# Patient Record
Sex: Female | Born: 1975 | Race: Black or African American | Hispanic: No | Marital: Single | Smoking: Never smoker
Health system: Southern US, Community
[De-identification: ages and names within clinical notes are randomized; demographics above are authoritative.]

## PROBLEM LIST (undated history)

## (undated) DIAGNOSIS — B009 Herpesviral infection, unspecified: Secondary | ICD-10-CM

## (undated) DIAGNOSIS — G43909 Migraine, unspecified, not intractable, without status migrainosus: Secondary | ICD-10-CM

## (undated) DIAGNOSIS — E039 Hypothyroidism, unspecified: Secondary | ICD-10-CM

## (undated) DIAGNOSIS — M797 Fibromyalgia: Secondary | ICD-10-CM

## (undated) DIAGNOSIS — B029 Zoster without complications: Secondary | ICD-10-CM

## (undated) DIAGNOSIS — R112 Nausea with vomiting, unspecified: Secondary | ICD-10-CM

## (undated) HISTORY — DX: Hypothyroidism, unspecified: E03.9

## (undated) HISTORY — DX: Herpesviral infection, unspecified: B00.9

## (undated) HISTORY — DX: Nausea with vomiting, unspecified: R11.2

## (undated) HISTORY — PX: CHOLECYSTECTOMY: SHX55

## (undated) HISTORY — DX: Fibromyalgia: M79.7

## (undated) HISTORY — PX: TUBAL LIGATION: SHX77

## (undated) HISTORY — DX: Zoster without complications: B02.9

## (undated) HISTORY — DX: Migraine, unspecified, not intractable, without status migrainosus: G43.909

---

## 2000-11-07 HISTORY — PX: ABDOMINAL HYSTERECTOMY: SHX81

## 2003-01-04 ENCOUNTER — Emergency Department (HOSPITAL_COMMUNITY): Admission: EM | Admit: 2003-01-04 | Discharge: 2003-01-05 | Payer: Self-pay | Admitting: Emergency Medicine

## 2003-01-05 ENCOUNTER — Encounter: Payer: Self-pay | Admitting: Emergency Medicine

## 2003-03-13 ENCOUNTER — Other Ambulatory Visit: Admission: RE | Admit: 2003-03-13 | Discharge: 2003-03-13 | Payer: Self-pay | Admitting: Obstetrics and Gynecology

## 2003-07-28 ENCOUNTER — Encounter: Admission: RE | Admit: 2003-07-28 | Discharge: 2003-07-28 | Payer: Self-pay | Admitting: Internal Medicine

## 2003-07-28 ENCOUNTER — Encounter: Payer: Self-pay | Admitting: Internal Medicine

## 2005-01-15 ENCOUNTER — Ambulatory Visit (HOSPITAL_COMMUNITY): Admission: RE | Admit: 2005-01-15 | Discharge: 2005-01-15 | Payer: Self-pay | Admitting: *Deleted

## 2005-07-22 ENCOUNTER — Emergency Department (HOSPITAL_COMMUNITY): Admission: EM | Admit: 2005-07-22 | Discharge: 2005-07-23 | Payer: Self-pay | Admitting: Emergency Medicine

## 2006-05-22 ENCOUNTER — Emergency Department (HOSPITAL_COMMUNITY): Admission: EM | Admit: 2006-05-22 | Discharge: 2006-05-22 | Payer: Self-pay | Admitting: Family Medicine

## 2006-12-08 ENCOUNTER — Emergency Department (HOSPITAL_COMMUNITY): Admission: EM | Admit: 2006-12-08 | Discharge: 2006-12-09 | Payer: Self-pay | Admitting: Emergency Medicine

## 2007-02-12 ENCOUNTER — Emergency Department (HOSPITAL_COMMUNITY): Admission: EM | Admit: 2007-02-12 | Discharge: 2007-02-12 | Payer: Self-pay | Admitting: Family Medicine

## 2007-03-11 ENCOUNTER — Emergency Department (HOSPITAL_COMMUNITY): Admission: EM | Admit: 2007-03-11 | Discharge: 2007-03-11 | Payer: Self-pay | Admitting: Family Medicine

## 2007-06-20 ENCOUNTER — Encounter: Admission: RE | Admit: 2007-06-20 | Discharge: 2007-06-20 | Payer: Self-pay | Admitting: Internal Medicine

## 2007-06-21 ENCOUNTER — Emergency Department (HOSPITAL_COMMUNITY): Admission: EM | Admit: 2007-06-21 | Discharge: 2007-06-21 | Payer: Self-pay | Admitting: Emergency Medicine

## 2007-10-22 ENCOUNTER — Inpatient Hospital Stay (HOSPITAL_COMMUNITY): Admission: AD | Admit: 2007-10-22 | Discharge: 2007-10-22 | Payer: Self-pay | Admitting: Obstetrics and Gynecology

## 2007-11-13 ENCOUNTER — Emergency Department (HOSPITAL_COMMUNITY): Admission: EM | Admit: 2007-11-13 | Discharge: 2007-11-13 | Payer: Self-pay | Admitting: Family Medicine

## 2007-12-31 ENCOUNTER — Encounter: Admission: RE | Admit: 2007-12-31 | Discharge: 2007-12-31 | Payer: Self-pay | Admitting: Gastroenterology

## 2008-07-07 ENCOUNTER — Emergency Department (HOSPITAL_COMMUNITY): Admission: EM | Admit: 2008-07-07 | Discharge: 2008-07-07 | Payer: Self-pay | Admitting: Emergency Medicine

## 2008-08-06 ENCOUNTER — Emergency Department (HOSPITAL_BASED_OUTPATIENT_CLINIC_OR_DEPARTMENT_OTHER): Admission: EM | Admit: 2008-08-06 | Discharge: 2008-08-06 | Payer: Self-pay | Admitting: Emergency Medicine

## 2008-08-21 ENCOUNTER — Encounter: Admission: RE | Admit: 2008-08-21 | Discharge: 2008-08-21 | Payer: Self-pay | Admitting: Internal Medicine

## 2008-08-23 ENCOUNTER — Emergency Department (HOSPITAL_BASED_OUTPATIENT_CLINIC_OR_DEPARTMENT_OTHER): Admission: EM | Admit: 2008-08-23 | Discharge: 2008-08-23 | Payer: Self-pay | Admitting: Emergency Medicine

## 2008-09-30 ENCOUNTER — Encounter: Admission: RE | Admit: 2008-09-30 | Discharge: 2008-11-06 | Payer: Self-pay | Admitting: Neurosurgery

## 2008-12-16 ENCOUNTER — Ambulatory Visit: Payer: Self-pay | Admitting: Physical Medicine & Rehabilitation

## 2008-12-16 ENCOUNTER — Ambulatory Visit (HOSPITAL_COMMUNITY)
Admission: RE | Admit: 2008-12-16 | Discharge: 2008-12-16 | Payer: Self-pay | Admitting: Physical Medicine & Rehabilitation

## 2008-12-16 ENCOUNTER — Encounter
Admission: RE | Admit: 2008-12-16 | Discharge: 2008-12-16 | Payer: Self-pay | Admitting: Physical Medicine & Rehabilitation

## 2008-12-29 ENCOUNTER — Encounter: Admission: RE | Admit: 2008-12-29 | Discharge: 2008-12-30 | Payer: Self-pay | Admitting: Anesthesiology

## 2008-12-30 ENCOUNTER — Ambulatory Visit: Payer: Self-pay | Admitting: Anesthesiology

## 2009-01-08 ENCOUNTER — Emergency Department (HOSPITAL_BASED_OUTPATIENT_CLINIC_OR_DEPARTMENT_OTHER): Admission: EM | Admit: 2009-01-08 | Discharge: 2009-01-09 | Payer: Self-pay | Admitting: Emergency Medicine

## 2009-01-09 ENCOUNTER — Ambulatory Visit: Payer: Self-pay | Admitting: Gynecology

## 2009-10-01 IMAGING — US US TRANSVAGINAL NON-OB
1 series · 17 of 25 positions shown · non-contrast
Comparison: Previous exam 12/09/06.

CLINICAL DATA: Hysterectomy and right oophorectomy with pain since last night.  Negative urine pregnancy test.  
TRANSVAGINAL PELVIC ULTRASOUND:
TECHNIQUE: Transvaginal ultrasound examination of the pelvis was performed.

[Series 1: us transvaginal non-ob · 17 of 29 slices shown]
[im 1/29]
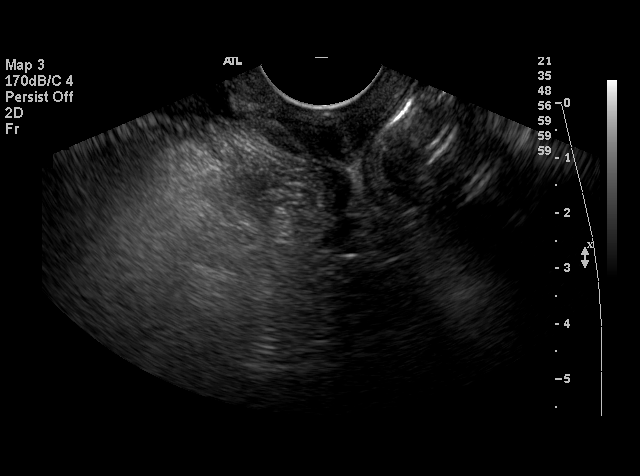
[im 3/29]
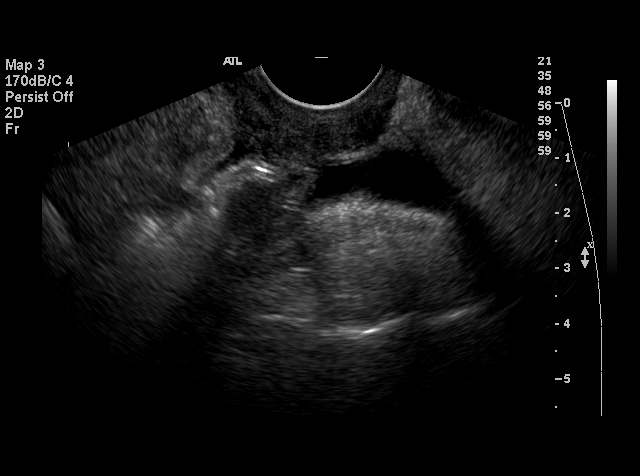
[im 4/29]
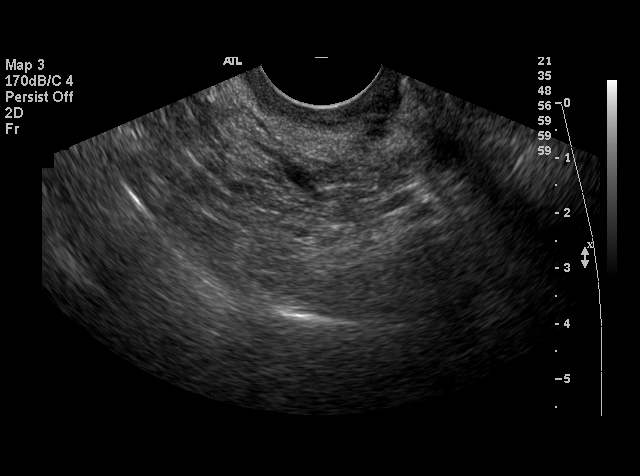
[im 6/29]
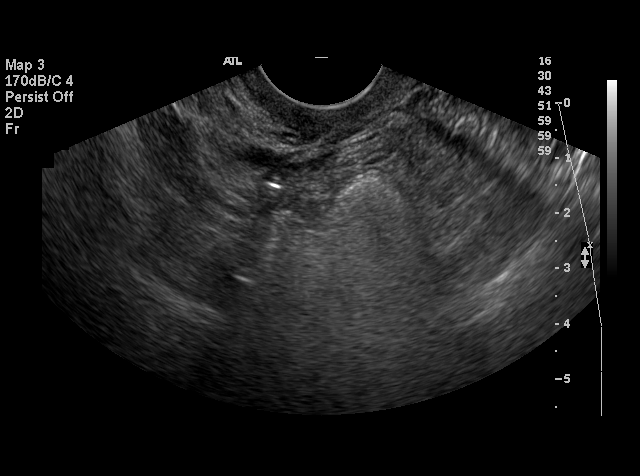
[im 8/29]
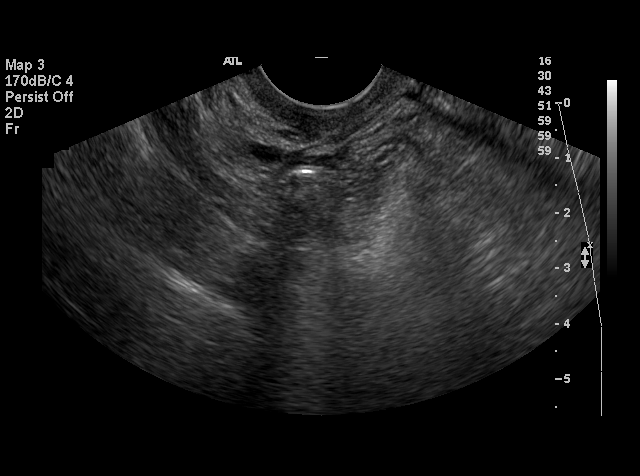
[im 10/29]
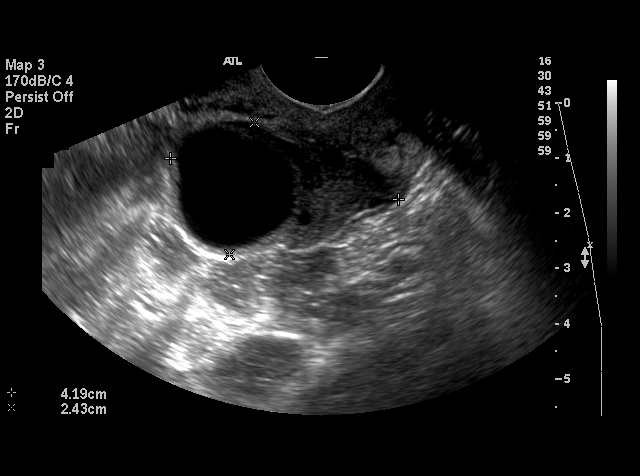
[im 11/29]
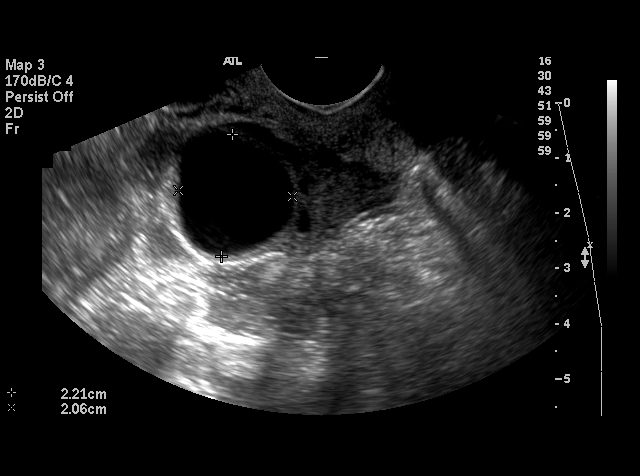
[im 13/29]
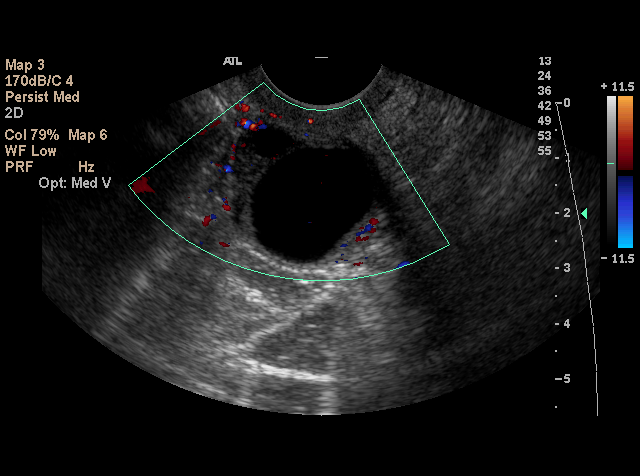
[im 15/29]
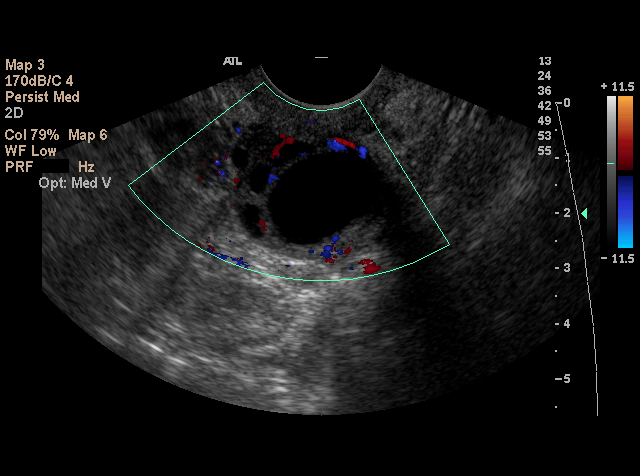
[im 16/29]
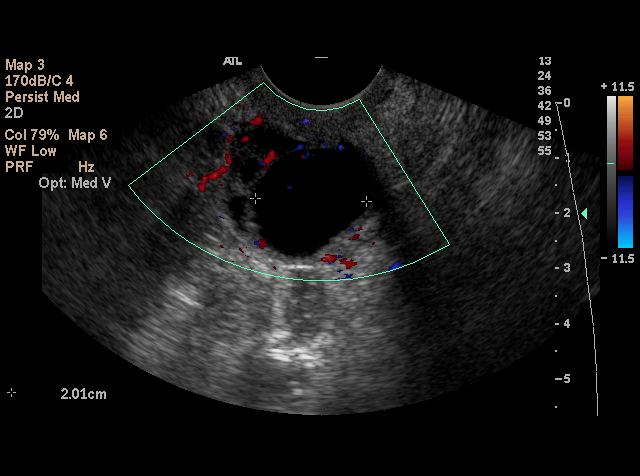
[im 18/29]
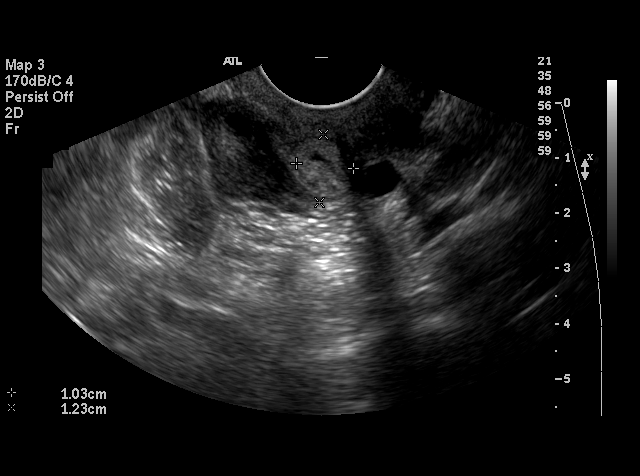
[im 19/29]
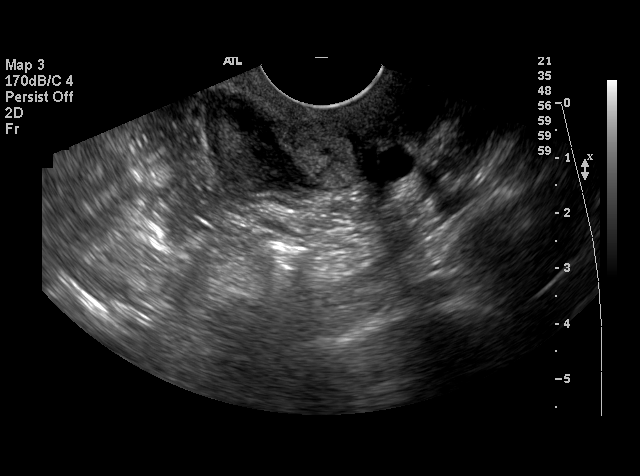
[im 22/29]
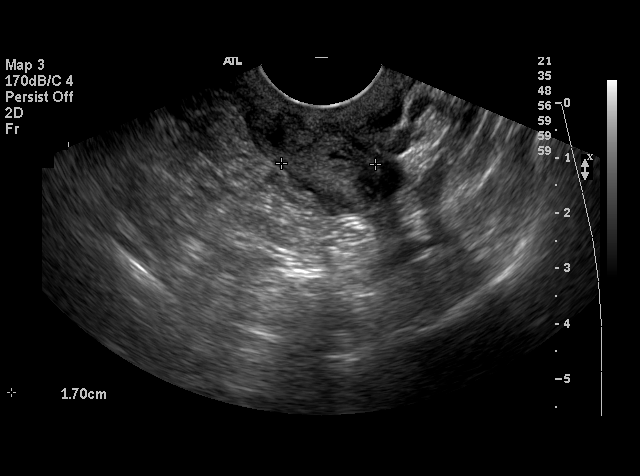
[im 23/29]
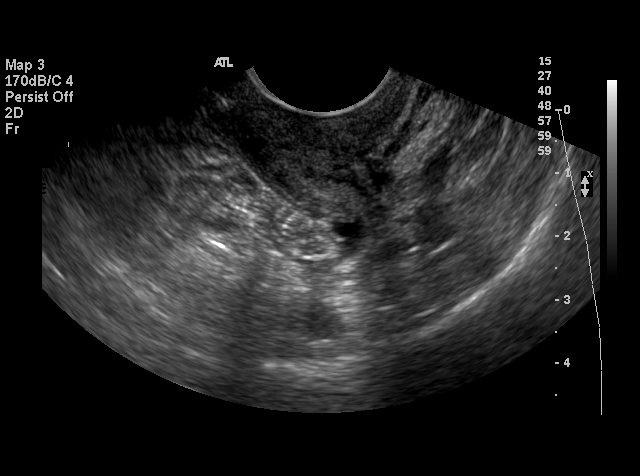
[im 25/29]
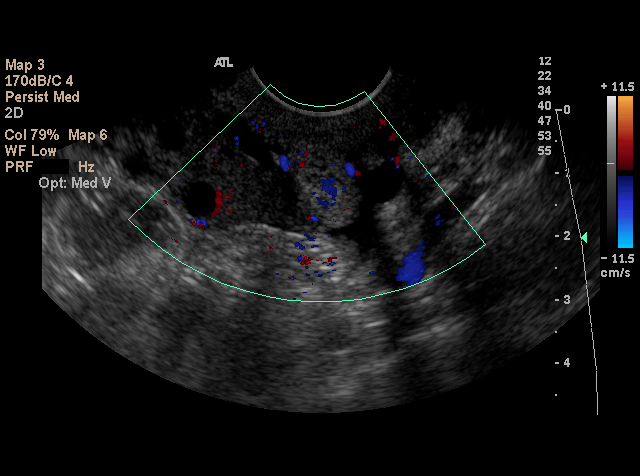
[im 26/29]
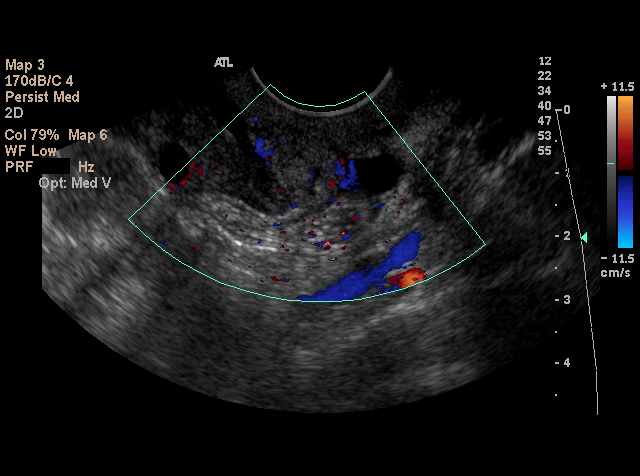
[im 29/29]
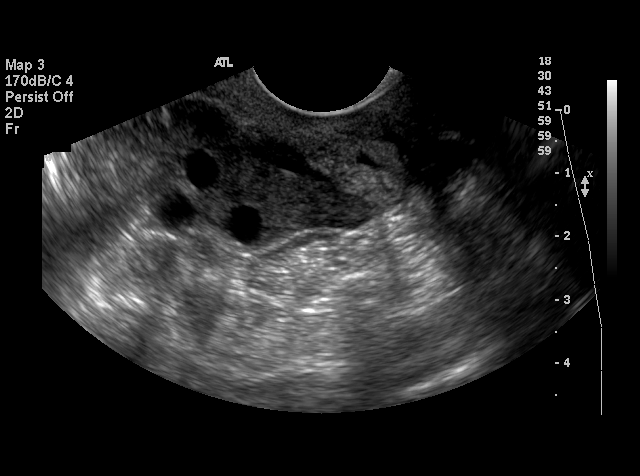

[17 of 25 positions shown; findings below may reference images not displayed]

FINDINGS: A normal vaginal cuff is identified.  The left ovary measures 4.2 x 2.4 x 2.8 cm and has a normal appearance.  A unilocular simple cyst is identified measuring 2.2 x 2.1 x 2.0 cm and in this premenopausal patient likely represents a dominant follicle.  Normal appearing small follicles are seen in the remaining stroma and a normal color flow pattern is identified.  Inferior to the left ovary is a small echogenic area of soft tissue measuring 1.0 x 1.2 x 1.7 cm.  Retrospectively, this was seen in the sagittal plane on similar images on 12/09/06 and likely represents a postoperative appearance of the fallopian tube on this side.  The patient did experience some tenderness in this region during scanning and if clinical concern warrants, follow-up to assess for stability can be undertaken.  Lack of interval change in the appearance since December 2006 strongly suggests that this is benign in etiology.  A small amount of simple free fluid is noted in the cul-de-sac.
IMPRESSION: 1.  Probable dominant follicle in the left ovary with an otherwise normal ovarian appearance.  
2.  Small amount of soft tissue inferior to the left ovary which appears stable in comparison with a prior exam from December 2006 and strongly suggests that this is benign in etiology and likely postoperative in nature.  A negative urine pregnancy test would exclude the possibility of an ectopic gestation.  Follow-up in 3 months can be undertaken to assess for stability of this suspected benign process if desired.

## 2010-02-05 ENCOUNTER — Ambulatory Visit: Payer: Self-pay | Admitting: Women's Health

## 2010-04-09 ENCOUNTER — Emergency Department (HOSPITAL_BASED_OUTPATIENT_CLINIC_OR_DEPARTMENT_OTHER): Admission: EM | Admit: 2010-04-09 | Discharge: 2010-04-09 | Payer: Self-pay | Admitting: Emergency Medicine

## 2011-01-29 ENCOUNTER — Emergency Department (INDEPENDENT_AMBULATORY_CARE_PROVIDER_SITE_OTHER): Payer: Medicaid Other

## 2011-01-29 ENCOUNTER — Emergency Department (HOSPITAL_BASED_OUTPATIENT_CLINIC_OR_DEPARTMENT_OTHER)
Admission: EM | Admit: 2011-01-29 | Discharge: 2011-01-29 | Disposition: A | Discharge status: Home / Self Care | Payer: Medicaid Other | Attending: Emergency Medicine | Admitting: Emergency Medicine

## 2011-01-29 DIAGNOSIS — R1032 Left lower quadrant pain: Secondary | ICD-10-CM | POA: Insufficient documentation

## 2011-01-29 DIAGNOSIS — G8929 Other chronic pain: Secondary | ICD-10-CM | POA: Insufficient documentation

## 2011-01-29 LAB — COMPREHENSIVE METABOLIC PANEL
AST: 21 U/L (ref 0–37)
Albumin: 4.2 g/dL (ref 3.5–5.2)
Alkaline Phosphatase: 67 U/L (ref 39–117)
CO2: 23 mEq/L (ref 19–32)
Chloride: 103 mEq/L (ref 96–112)
Creatinine, Ser: 0.7 mg/dL (ref 0.4–1.2)
GFR calc Af Amer: >60 mL/min (ref >60)
GFR calc non Af Amer: >60 mL/min (ref >60)
Glucose, Bld: 102 mg/dL — ABNORMAL HIGH (ref 70–99)
Potassium: 4.5 mEq/L (ref 3.5–5.1)
Sodium: 139 mEq/L (ref 135–145)

## 2011-01-29 LAB — CBC
HCT: 36.8 % (ref 36.0–46.0)
Hemoglobin: 12.8 g/dL (ref 12.0–15.0)
Platelets: 294 10*3/uL (ref 150–400)
WBC: 10.4 10*3/uL (ref 4.0–10.5)

## 2011-01-29 LAB — URINALYSIS, ROUTINE W REFLEX MICROSCOPIC
Glucose, UA: NEGATIVE mg/dL
Nitrite: NEGATIVE
Protein, ur: NEGATIVE mg/dL
Specific Gravity, Urine: 1.028 (ref 1.005–1.030)
pH: 6 (ref 5.0–8.0)

## 2011-01-29 LAB — DIFFERENTIAL
Basophils Relative: 0 % (ref 0–1)
Eosinophils Relative: 3 % (ref 0–5)
Lymphocytes Relative: 40 % (ref 12–46)
Neutro Abs: 5.2 10*3/uL (ref 1.7–7.7)
Neutrophils Relative %: 50 % (ref 43–77)

## 2011-01-30 LAB — URINE CULTURE: Culture  Setup Time: 201203242151

## 2011-02-17 LAB — DIFFERENTIAL
Basophils Absolute: 0.3 10*3/uL — ABNORMAL HIGH (ref 0.0–0.1)
Eosinophils Relative: 1 % (ref 0–5)
Lymphocytes Relative: 29 % (ref 12–46)
Lymphs Abs: 3.5 10*3/uL (ref 0.7–4.0)
Monocytes Absolute: 0.8 10*3/uL (ref 0.1–1.0)
Neutro Abs: 7.2 10*3/uL (ref 1.7–7.7)

## 2011-02-17 LAB — URINALYSIS, ROUTINE W REFLEX MICROSCOPIC
Bilirubin Urine: NEGATIVE
Glucose, UA: NEGATIVE mg/dL
Ketones, ur: NEGATIVE mg/dL
Nitrite: NEGATIVE
Specific Gravity, Urine: 1.022 (ref 1.005–1.030)
pH: 6 (ref 5.0–8.0)

## 2011-02-17 LAB — CBC
HCT: 42.8 % (ref 36.0–46.0)
Hemoglobin: 13.9 g/dL (ref 12.0–15.0)
RBC: 4.86 MIL/uL (ref 3.87–5.11)
WBC: 11.9 10*3/uL — ABNORMAL HIGH (ref 4.0–10.5)

## 2011-02-17 LAB — BASIC METABOLIC PANEL
Calcium: 9.8 mg/dL (ref 8.4–10.5)
Creatinine, Ser: 0.7 mg/dL (ref 0.4–1.2)
GFR calc non Af Amer: >60 mL/min (ref >60)
Glucose, Bld: 131 mg/dL — ABNORMAL HIGH (ref 70–99)
Sodium: 142 mEq/L (ref 135–145)

## 2011-03-14 ENCOUNTER — Emergency Department (HOSPITAL_COMMUNITY)
Admission: EM | Admit: 2011-03-14 | Discharge: 2011-03-15 | Disposition: A | Discharge status: Home / Self Care | Payer: Medicaid Other | Attending: Emergency Medicine | Admitting: Emergency Medicine

## 2011-03-14 ENCOUNTER — Emergency Department (HOSPITAL_COMMUNITY): Payer: Medicaid Other

## 2011-03-14 DIAGNOSIS — Z79899 Other long term (current) drug therapy: Secondary | ICD-10-CM | POA: Insufficient documentation

## 2011-03-14 DIAGNOSIS — E039 Hypothyroidism, unspecified: Secondary | ICD-10-CM | POA: Insufficient documentation

## 2011-03-14 DIAGNOSIS — G8929 Other chronic pain: Secondary | ICD-10-CM | POA: Insufficient documentation

## 2011-03-14 DIAGNOSIS — M549 Dorsalgia, unspecified: Secondary | ICD-10-CM | POA: Insufficient documentation

## 2011-03-14 DIAGNOSIS — R1033 Periumbilical pain: Secondary | ICD-10-CM | POA: Insufficient documentation

## 2011-03-14 LAB — CBC
Platelets: 294 10*3/uL (ref 150–400)
RBC: 4.37 MIL/uL (ref 3.87–5.11)
RDW: 12.1 % (ref 11.5–15.5)
WBC: 10.6 10*3/uL — ABNORMAL HIGH (ref 4.0–10.5)

## 2011-03-14 LAB — DIFFERENTIAL
Basophils Absolute: 0 10*3/uL (ref 0.0–0.1)
Eosinophils Absolute: 0.3 10*3/uL (ref 0.0–0.7)
Eosinophils Relative: 3 % (ref 0–5)
Neutrophils Relative %: 40 % — ABNORMAL LOW (ref 43–77)

## 2011-03-14 LAB — URINALYSIS, ROUTINE W REFLEX MICROSCOPIC
Glucose, UA: NEGATIVE mg/dL
Ketones, ur: NEGATIVE mg/dL
Nitrite: NEGATIVE
Protein, ur: NEGATIVE mg/dL
pH: 6 (ref 5.0–8.0)

## 2011-03-14 LAB — COMPREHENSIVE METABOLIC PANEL
ALT: 15 U/L (ref 0–35)
AST: 16 U/L (ref 0–37)
Albumin: 4.2 g/dL (ref 3.5–5.2)
Alkaline Phosphatase: 61 U/L (ref 39–117)
Chloride: 101 mEq/L (ref 96–112)
GFR calc Af Amer: >60 mL/min (ref >60)
Potassium: 4.2 mEq/L (ref 3.5–5.1)
Sodium: 137 mEq/L (ref 135–145)
Total Protein: 7.5 g/dL (ref 6.0–8.3)

## 2011-03-14 LAB — POCT PREGNANCY, URINE: Preg Test, Ur: NEGATIVE

## 2011-03-15 ENCOUNTER — Emergency Department (HOSPITAL_COMMUNITY): Payer: Medicaid Other

## 2011-03-15 ENCOUNTER — Encounter (HOSPITAL_COMMUNITY): Payer: Self-pay | Admitting: Radiology

## 2011-03-15 MED ORDER — IOHEXOL 300 MG/ML  SOLN
100.0000 mL | Freq: Once | INTRAMUSCULAR | Status: AC | PRN
  Administered 2011-03-15: 100 mL via INTRAVENOUS

## 2011-03-22 NOTE — Procedures (Signed)
NAME:  DYMIN, DINGLEDINE NO.:  [id number]   MEDICAL RECORD NO.:  [id number]          PATIENT TYPE:  REC   LOCATION:  [facility]                          FACILITY:  [facility]   PHYSICIAN:  Celene Kras, MD        DATE OF BIRTH:  12-Dec-1975   DATE OF PROCEDURE:  DATE OF DISCHARGE:                               OPERATIVE REPORT   [name] [facility].  I evaluated  her and reviewed the health and history form, 14-point review of  systems.   1. We recently considered going on the cervical facet intervention,      but she is having some right arm pain consistent with 6-7.  It goes      to her fingers.  She states she has trouble moving from time to      time.  We may need to get nerve conduction studies.  2. Other modifiable features discussed.  She is not working at this      time, another rationale to perform intervention is to move toward      return to work, and to minimize escalation controlled substances.      She appears to be forthright and engaging, and wants to be as      active as possible.  We will do what we can here.  I think as she      does have some arm pain, it is probably reasonable to go on to      cervical epidural to follow her expectantly.  If the arm pain      improves, we might address the facet, RF could be an option.  I      have reviewed the imaging.   Objectively, diffuse paracervical myofascial discomfort, positive  cervical facetal compression test positive the triceps reflex to the  affected side, right is somewhat diminished, pain with extension.  Nothing new neurologically.   IMPRESSION:  Degenerative spine disease, cervical spine with arm pain,  spondylosis.   PLAN:  Cervical epidural.  She has consented.   The patient was taken to the fluoroscopy and placed in prone position.  The neck prepped and draped in the usual fashion.  Using a [place]  needle, I advanced C7-T1 interspace without any evidence of  CSF, heme,  or paresthesia.  Test block uneventfully followed by 40 mg of Aristocort  and flushed needle.   She tolerated procedure well.  No complications from our procedure.  Appropriate recovery.  Discharge instructions given.           ______________________________  Celene Kras, MD     HH/MEDQ  D:  12/30/2008 13:31:30  T:  12/31/2008 47:82:95  Job:  [id number]
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
## 2011-04-07 ENCOUNTER — Other Ambulatory Visit (HOSPITAL_COMMUNITY): Payer: Self-pay | Admitting: Gastroenterology

## 2011-04-08 ENCOUNTER — Ambulatory Visit: Payer: Medicaid Other | Admitting: Women's Health

## 2011-04-11 ENCOUNTER — Ambulatory Visit (INDEPENDENT_AMBULATORY_CARE_PROVIDER_SITE_OTHER): Payer: Medicaid Other | Admitting: Women's Health

## 2011-04-11 ENCOUNTER — Ambulatory Visit (HOSPITAL_COMMUNITY)
Admission: RE | Admit: 2011-04-11 | Discharge: 2011-04-11 | Disposition: A | Discharge status: Home / Self Care | Payer: Medicaid Other | Source: Ambulatory Visit | Attending: Gastroenterology | Admitting: Gastroenterology

## 2011-04-11 DIAGNOSIS — K8020 Calculus of gallbladder without cholecystitis without obstruction: Secondary | ICD-10-CM | POA: Insufficient documentation

## 2011-04-11 DIAGNOSIS — N898 Other specified noninflammatory disorders of vagina: Secondary | ICD-10-CM

## 2011-04-11 DIAGNOSIS — N949 Unspecified condition associated with female genital organs and menstrual cycle: Secondary | ICD-10-CM

## 2011-04-11 DIAGNOSIS — B373 Candidiasis of vulva and vagina: Secondary | ICD-10-CM

## 2011-04-11 DIAGNOSIS — R109 Unspecified abdominal pain: Secondary | ICD-10-CM | POA: Insufficient documentation

## 2011-04-11 DIAGNOSIS — R112 Nausea with vomiting, unspecified: Secondary | ICD-10-CM | POA: Insufficient documentation

## 2011-04-18 ENCOUNTER — Ambulatory Visit (INDEPENDENT_AMBULATORY_CARE_PROVIDER_SITE_OTHER): Payer: Medicaid Other | Admitting: Women's Health

## 2011-04-18 ENCOUNTER — Other Ambulatory Visit: Payer: Medicaid Other

## 2011-04-18 DIAGNOSIS — R1032 Left lower quadrant pain: Secondary | ICD-10-CM

## 2011-04-18 DIAGNOSIS — N949 Unspecified condition associated with female genital organs and menstrual cycle: Secondary | ICD-10-CM

## 2011-04-20 ENCOUNTER — Other Ambulatory Visit (HOSPITAL_COMMUNITY): Payer: Medicaid Other

## 2011-05-06 ENCOUNTER — Encounter (HOSPITAL_COMMUNITY)
Admission: RE | Admit: 2011-05-06 | Discharge: 2011-05-06 | Disposition: A | Discharge status: Home / Self Care | Payer: Medicaid Other | Source: Ambulatory Visit | Attending: General Surgery | Admitting: General Surgery

## 2011-05-06 LAB — COMPREHENSIVE METABOLIC PANEL
AST: 16 U/L (ref 0–37)
CO2: 27 mEq/L (ref 19–32)
Calcium: 9.3 mg/dL (ref 8.4–10.5)
Creatinine, Ser: 0.89 mg/dL (ref 0.50–1.10)
GFR calc Af Amer: >60 mL/min (ref >60)
GFR calc non Af Amer: >60 mL/min (ref >60)
Glucose, Bld: 108 mg/dL — ABNORMAL HIGH (ref 70–99)

## 2011-05-06 LAB — CBC
MCH: 27.5 pg (ref 26.0–34.0)
MCHC: 33.2 g/dL (ref 30.0–36.0)
MCV: 82.8 fL (ref 78.0–100.0)
Platelets: 291 10*3/uL (ref 150–400)
RBC: 4.48 MIL/uL (ref 3.87–5.11)

## 2011-05-06 LAB — SURGICAL PCR SCREEN: Staphylococcus aureus: NEGATIVE

## 2011-05-06 LAB — DIFFERENTIAL
Eosinophils Absolute: 0.3 10*3/uL (ref 0.0–0.7)
Lymphs Abs: 5.1 10*3/uL — ABNORMAL HIGH (ref 0.7–4.0)
Monocytes Absolute: 0.7 10*3/uL (ref 0.1–1.0)
Monocytes Relative: 7 % (ref 3–12)
Neutrophils Relative %: 40 % — ABNORMAL LOW (ref 43–77)

## 2011-05-12 ENCOUNTER — Ambulatory Visit (HOSPITAL_COMMUNITY): Payer: Medicaid Other

## 2011-05-12 ENCOUNTER — Ambulatory Visit (HOSPITAL_COMMUNITY)
Admission: RE | Admit: 2011-05-12 | Discharge: 2011-05-12 | Disposition: A | Discharge status: Home / Self Care | Payer: Medicaid Other | Source: Ambulatory Visit | Attending: General Surgery | Admitting: General Surgery

## 2011-05-12 ENCOUNTER — Other Ambulatory Visit (INDEPENDENT_AMBULATORY_CARE_PROVIDER_SITE_OTHER): Payer: Self-pay | Admitting: General Surgery

## 2011-05-12 DIAGNOSIS — K8020 Calculus of gallbladder without cholecystitis without obstruction: Secondary | ICD-10-CM | POA: Insufficient documentation

## 2011-05-12 DIAGNOSIS — K660 Peritoneal adhesions (postprocedural) (postinfection): Secondary | ICD-10-CM | POA: Insufficient documentation

## 2011-05-12 DIAGNOSIS — Z01812 Encounter for preprocedural laboratory examination: Secondary | ICD-10-CM | POA: Insufficient documentation

## 2011-05-12 DIAGNOSIS — N83209 Unspecified ovarian cyst, unspecified side: Secondary | ICD-10-CM | POA: Insufficient documentation

## 2011-05-12 DIAGNOSIS — K824 Cholesterolosis of gallbladder: Secondary | ICD-10-CM

## 2011-05-12 DIAGNOSIS — K8010 Calculus of gallbladder with chronic cholecystitis without obstruction: Secondary | ICD-10-CM

## 2011-05-24 ENCOUNTER — Ambulatory Visit (INDEPENDENT_AMBULATORY_CARE_PROVIDER_SITE_OTHER): Payer: Medicaid Other | Admitting: General Surgery

## 2011-05-24 ENCOUNTER — Encounter (INDEPENDENT_AMBULATORY_CARE_PROVIDER_SITE_OTHER): Payer: Self-pay | Admitting: General Surgery

## 2011-05-24 VITALS — BP 118/88 | HR 72 | Temp 97.2°F | Ht 60.0 in | Wt 151.0 lb

## 2011-05-24 DIAGNOSIS — R112 Nausea with vomiting, unspecified: Secondary | ICD-10-CM

## 2011-05-24 DIAGNOSIS — Z9889 Other specified postprocedural states: Secondary | ICD-10-CM

## 2011-05-24 MED ORDER — PROMETHAZINE HCL 12.5 MG PO TABS: 12.5000 mg | ORAL_TABLET | Freq: Four times a day (QID) | ORAL | Status: DC | PRN

## 2011-05-24 NOTE — Progress Notes (Signed)
HPI The patient comes in today complaining of inability to eat, nausea, and vomiting. We had to give the patient an emesis basin in the waiting room because of nausea vomiting. She states is inserted and she's not been able to eat anything substantially. She also states that she's had significant constipation that she has treated herself with MiraLax.  PE She's got good bowel sounds on examination. Wounds are healing well with no evidence of infection.  Studiy review There no studies to review today.  Assessment Postoperative nausea vomiting status post laparoscopic cholecystectomy along with an examination of her ovarian cyst on the left side  Plan I will prescribe the patient some Phenergan for nausea and vomiting.  I will get a CBC and a cemented nature as she's been having any significant problems with an intra-abdominal infection or bile leak I will see the patient back in 2 weeks for followup

## 2011-05-27 NOTE — Op Note (Signed)
Sara Garrison, CHOUNG NO.:  [id number]  MEDICAL RECORD NO.:  [id number]  LOCATION:  [facility]                         FACILITY:  [facility]  PHYSICIAN:  Cherylynn Ridges, M.D.    DATE OF BIRTH:  [birth year]  DATE OF PROCEDURE:  05/12/2011 DATE OF DISCHARGE:  05/12/2011                              OPERATIVE REPORT   PREOPERATIVE DIAGNOSES: 1. Symptomatic cholelithiasis. 2. Left ovarian cyst with possible pathology.  POSTOPERATIVE DIAGNOSES: 1. Cholelithiasis with symptoms. 2. Normal cholangiogram. 3. Adhesions of omentum to the pelvic round ligament. 4. Left ovarian cyst.  PROCEDURES: 1. Diagnostic laparoscopy. 2. Laparoscopic adhesiolysis. 3. Laparoscopic cholecystectomy with cholangiogram.  SURGEON:  Cherylynn Ridges, MD  ASSISTANT:  Dr. Janee Morn.  ANESTHESIA:  General endotracheal.  ESTIMATED BLOOD LOSS:  Less than 30 mL.  COMPLICATIONS AND CONDITIONS:  Stable.  INDICATIONS FOR OPERATION:  The patient is a 35 year old recently found to have gallstones with symptoms that are seemed to not be related to a gallstone with mostly left lower quadrant and left upper quadrant pain. She comes in now for lap chole and also for a diagnosis of possible pathology and a pelvic gynecological organs.  OPERATION:  The patient was taken to the operating room, placed on table in supine position.  After an adequate general endotracheal anesthetic was administered, she was prepped and draped in the usual sterile manner, exposing the entire abdomen.  After proper time-out was performed identifying the patient and procedure be performed, a supraumbilical midline incision was made using #15 blade and taken down to the midline fascia.  We incised the fascia with 15 blade and then grabbed the edges with Kocher clamps.  We pulled up on the Kocher clamps while bluntly dissecting down into the peritoneal cavity.  This was done with a Kelly clamp.  Once this was done, a pursestring  suture of 0 Vicryl was passed around the fascial opening which secured in the [facility] cannula which was subsequently passed.  We then insufflated carbon dioxide gas up to a maximal intra- abdominal pressure of 50 mmHg.  Once this was done, two right upper quadrant 5-mm cannulas in the right costal margin area were passed under direct vision.  We then placed the patient in Trendelenburg position with her right side tilted down in order to inspect the left ovarian organs and left gynecological organs.  It was noted that there was a tongue of omentum adhesed to the round ligament and also the left para-lateral pelvic wall which we were able to dissect away free as our laparoscopic adhesiolysis.  We are able to visualize the left ovary also which appeared to be enlarged and have a large cystic artery, but did not have any other evidence of pathology. Once this was done, we took some pictures of this area.  We went ahead and placed a subxiphoid 5-mm cannula under direct vision.  We went then placed the patient in reverse Trendelenburg with the left side tilted down to perform the cholecystectomy.  The gallbladder was retracted towards the right upper quadrant and the anterior abdominal wall.  The peritoneum overlying the triangle of Calot and hepatic duodenal triangle was dissected free as I isolated the cystic duct and  cystic artery.  Once the cystic duct was isolated, we clipped along the gallbladder inside, made it cholecystoductotomy through which a Cook catheter was passed in order to perform a cholangiogram.  The cholangiogram showed good flow into the duodenum and good proximal filling.  No evidence of intraductal filling defects.  Once cholangiogram was completed, we removed the catheter that was in place, clipped the distal cystic duct x3 and transected it.  We then dissected out the cystic artery which we clipped proximally and distally and then transected it.  We then dissected  out the gallbladder from its bed with minimal difficulty, although we did perforate the back wall of the gallbladder.  There was no spillage of stone and only minimal amount of spillage of bile.  Once the gallbladder was completely detached from the liver using an EndoCatch bag to retrieve it from the supraumbilical site.  Once this was done, we irrigated with a liter of saline and the bed, aspirated all fluid and gas from above and around the liver as we flattened out the patient and removed all cannulas.  The supraumbilical site was closed using a pursestring suture which was in place.  All other sites, we injected with 0.25% Marcaine with epi. We closed the skin at the supraumbilical site using running subcuticular stitch of 4 Monocryl.  All counts were correct including needles, sponges and instrument.  Once that was done, a sterile dressing applied including Dermabond, Steri-Strips and Tegaderm.     Cherylynn Ridges, M.D.     JOW/MEDQ  D:  05/12/2011  T:  05/13/2011  Job:  [id number]  Electronically Signed by Jimmye Norman M.D. on 05/26/2011 11:59:22 PM

## 2011-06-07 ENCOUNTER — Encounter (INDEPENDENT_AMBULATORY_CARE_PROVIDER_SITE_OTHER): Payer: Medicaid Other | Admitting: General Surgery

## 2011-06-21 ENCOUNTER — Encounter (INDEPENDENT_AMBULATORY_CARE_PROVIDER_SITE_OTHER): Payer: Self-pay

## 2011-06-21 ENCOUNTER — Encounter (INDEPENDENT_AMBULATORY_CARE_PROVIDER_SITE_OTHER): Payer: Self-pay | Admitting: General Surgery

## 2011-06-21 ENCOUNTER — Ambulatory Visit (INDEPENDENT_AMBULATORY_CARE_PROVIDER_SITE_OTHER): Payer: Medicaid Other | Admitting: General Surgery

## 2011-06-21 DIAGNOSIS — G8918 Other acute postprocedural pain: Secondary | ICD-10-CM

## 2011-06-21 DIAGNOSIS — R109 Unspecified abdominal pain: Secondary | ICD-10-CM

## 2011-06-21 LAB — CBC WITH DIFFERENTIAL/PLATELET
Basophils Absolute: 0 10*3/uL (ref 0.0–0.1)
Lymphocytes Relative: 47 % — ABNORMAL HIGH (ref 12–46)
Neutro Abs: 3.5 10*3/uL (ref 1.7–7.7)
Platelets: 300 10*3/uL (ref 150–400)
RDW: 13.2 % (ref 11.5–15.5)
WBC: 8.1 10*3/uL (ref 4.0–10.5)

## 2011-06-21 LAB — HEPATIC FUNCTION PANEL
Bilirubin, Direct: 0.1 mg/dL (ref 0.0–0.3)
Total Bilirubin: 0.3 mg/dL (ref 0.3–1.2)

## 2011-06-21 NOTE — Progress Notes (Signed)
HPI The patient continues to complain of nausea and abdominal pain particularly in the periumbilical area.  PE On her examination she has no evidence of hernia in her umbilical area. She has normal active bowel sounds no palpable masses and no peritonitis .  Studiy review There are currently no studies to review however I will check a CBC and a Cmet.  Assessment Postoperative continued nausea and abdominal pain. Clinically I did not have a lot of concern that he she's having any postoperative complications however I will check some studies to confirm this.  Plan Check a CBC and a liver function panel to determine if she has any concern for postoperative bile leak

## 2011-06-29 ENCOUNTER — Encounter (HOSPITAL_BASED_OUTPATIENT_CLINIC_OR_DEPARTMENT_OTHER): Payer: Self-pay | Admitting: *Deleted

## 2011-06-29 ENCOUNTER — Emergency Department (HOSPITAL_BASED_OUTPATIENT_CLINIC_OR_DEPARTMENT_OTHER)
Admission: EM | Admit: 2011-06-29 | Discharge: 2011-06-29 | Disposition: A | Discharge status: Home / Self Care | Payer: Medicaid Other | Attending: Emergency Medicine | Admitting: Emergency Medicine

## 2011-06-29 DIAGNOSIS — E039 Hypothyroidism, unspecified: Secondary | ICD-10-CM | POA: Insufficient documentation

## 2011-06-29 DIAGNOSIS — R109 Unspecified abdominal pain: Secondary | ICD-10-CM

## 2011-06-29 DIAGNOSIS — IMO0001 Reserved for inherently not codable concepts without codable children: Secondary | ICD-10-CM | POA: Insufficient documentation

## 2011-06-29 DIAGNOSIS — G8929 Other chronic pain: Secondary | ICD-10-CM | POA: Insufficient documentation

## 2011-06-29 LAB — URINALYSIS, ROUTINE W REFLEX MICROSCOPIC
Ketones, ur: 15 mg/dL — AB
Leukocytes, UA: NEGATIVE
Nitrite: NEGATIVE
Protein, ur: NEGATIVE mg/dL
pH: 5 (ref 5.0–8.0)

## 2011-06-29 NOTE — ED Notes (Signed)
Pt states abd pain " on and off" since July, when gall bladder was removed

## 2011-06-29 NOTE — ED Notes (Signed)
Pt sent with prescription for out pt ct and labs, but pt decided to check in ed instead.

## 2011-06-29 NOTE — ED Provider Notes (Signed)
History     CSN: [id number] Arrival date & time: 06/29/2011  4:26 PM  Chief Complaint  Patient presents with  . Abdominal Pain   Patient is a 35 y.o. female presenting with abdominal pain. The history is provided by the patient. No language interpreter was used.  Abdominal Pain The primary symptoms of the illness include abdominal pain. The primary symptoms of the illness do not include fever, shortness of breath, vomiting, diarrhea, dysuria, vaginal discharge or vaginal bleeding. The current episode started more than 2 days ago. The onset of the illness was gradual. The problem has been gradually worsening.  Associated with: gallbladder surgery in July. The patient states that she believes she is currently not pregnant. The patient has had a change in bowel habit. Additional symptoms associated with the illness include constipation and frequency. Significant associated medical issues do not include PUD, sickle cell disease or diverticulitis.  Pt was seen by Dr. Lovell Sheehan in the office today.  Pt was given an Rx for a laxative and dilaudid.  Pt has seen Dr. Lindie Spruce for the same and was told it may take some time to heal.  Pt was sent out a ua.   Past Medical History  Diagnosis Date  . HSV-1 (herpes simplex virus 1) infection   . Hypothyroid   . Fibromyalgia   . Migraines   . Nausea & vomiting   . Shingles     Past Surgical History  Procedure Date  . Abdominal hysterectomy 2002    RSO   . Tubal ligation   . Cholecystectomy     Family History  Problem Relation Age of Onset  . Hypertension Mother   . Diabetes Mother     History  Substance Use Topics  . Smoking status: Never Smoker   . Smokeless tobacco: Not on file  . Alcohol Use: Yes    OB History    Grav Para Term Preterm Abortions TAB SAB Ect Mult Living                  Review of Systems  Constitutional: Negative for fever.  Respiratory: Negative for shortness of breath.   Gastrointestinal: Positive for abdominal  pain and constipation. Negative for vomiting and diarrhea.  Genitourinary: Positive for frequency. Negative for dysuria, vaginal bleeding and vaginal discharge.  All other systems reviewed and are negative.    Physical Exam  BP 136/72  Pulse 82  Temp(Src) 97.9 F (36.6 C) (Oral)  Resp 16  Wt 156 lb (70.761 kg)  SpO2 100%  Physical Exam  Nursing note and vitals reviewed. Constitutional: She is oriented to person, place, and time. She appears well-developed and well-nourished.  HENT:  Head: Normocephalic and atraumatic.  Eyes: Conjunctivae and EOM are normal. Pupils are equal, round, and reactive to light.  Neck: Normal range of motion. Neck supple.  Cardiovascular: Normal rate.   Pulmonary/Chest: Effort normal.  Abdominal: Soft. There is tenderness.  Musculoskeletal: Normal range of motion.  Neurological: She is alert and oriented to person, place, and time. She has normal reflexes.  Skin: Skin is warm and dry.  Psychiatric: She has a normal mood and affect.    ED Course  Procedures  MDM ua shows ketones,  Pt advised to follow with Dr. Lindie Spruce and Dr.  Lovell Sheehan as scheduled.  Medications as prescribed by Dr. Lovell Sheehan.      Langston Masker, [place] 06/29/11 1850  [place], [place] 06/29/11 [phone]

## 2011-06-30 NOTE — ED Provider Notes (Signed)
Medical screening examination/treatment/procedure(s) were performed by non-physician practitioner and as supervising physician I was immediately available for consultation/collaboration.   Ociel Retherford M Meerab Maselli, MD 06/30/11 0010 

## 2011-07-13 ENCOUNTER — Other Ambulatory Visit: Payer: Self-pay | Admitting: Internal Medicine

## 2011-07-15 ENCOUNTER — Ambulatory Visit
Admission: RE | Admit: 2011-07-15 | Discharge: 2011-07-15 | Disposition: A | Discharge status: Home / Self Care | Payer: Medicaid Other | Source: Ambulatory Visit | Attending: Internal Medicine | Admitting: Internal Medicine

## 2011-07-15 MED ORDER — IOHEXOL 300 MG/ML  SOLN
100.0000 mL | Freq: Once | INTRAMUSCULAR | Status: AC | PRN
  Administered 2011-07-15: 100 mL via INTRAVENOUS

## 2011-08-08 LAB — BASIC METABOLIC PANEL
BUN: 7
Creatinine, Ser: 0.7
GFR calc Af Amer: >60
GFR calc non Af Amer: >60

## 2011-08-08 LAB — URINALYSIS, ROUTINE W REFLEX MICROSCOPIC
Glucose, UA: NEGATIVE
Protein, ur: NEGATIVE
Specific Gravity, Urine: 1.022
pH: 5.5

## 2011-08-08 LAB — DIFFERENTIAL
Lymphocytes Relative: 36
Lymphs Abs: 4.5 — ABNORMAL HIGH
Neutro Abs: 7.1
Neutrophils Relative %: 56

## 2011-08-08 LAB — CBC
Platelets: 263
RBC: 4.68
WBC: 12.6 — ABNORMAL HIGH

## 2011-08-12 LAB — URINALYSIS, ROUTINE W REFLEX MICROSCOPIC
Ketones, ur: NEGATIVE
Nitrite: NEGATIVE
Specific Gravity, Urine: >1.03 — ABNORMAL HIGH
pH: 5.5

## 2011-08-12 LAB — CBC
Hemoglobin: 12.5
RDW: 12

## 2011-08-22 LAB — URINALYSIS, ROUTINE W REFLEX MICROSCOPIC
Glucose, UA: NEGATIVE
Protein, ur: NEGATIVE
Urobilinogen, UA: 0.2

## 2011-08-22 LAB — POCT PREGNANCY, URINE: Operator id: 256701

## 2011-10-16 ENCOUNTER — Emergency Department (HOSPITAL_BASED_OUTPATIENT_CLINIC_OR_DEPARTMENT_OTHER)
Admission: EM | Admit: 2011-10-16 | Discharge: 2011-10-16 | Disposition: A | Discharge status: Home / Self Care | Payer: Medicaid Other | Attending: Emergency Medicine | Admitting: Emergency Medicine

## 2011-10-16 ENCOUNTER — Encounter (HOSPITAL_BASED_OUTPATIENT_CLINIC_OR_DEPARTMENT_OTHER): Payer: Self-pay | Admitting: *Deleted

## 2011-10-16 DIAGNOSIS — Z79899 Other long term (current) drug therapy: Secondary | ICD-10-CM | POA: Insufficient documentation

## 2011-10-16 DIAGNOSIS — R109 Unspecified abdominal pain: Secondary | ICD-10-CM | POA: Insufficient documentation

## 2011-10-16 DIAGNOSIS — J111 Influenza due to unidentified influenza virus with other respiratory manifestations: Secondary | ICD-10-CM | POA: Insufficient documentation

## 2011-10-16 DIAGNOSIS — R42 Dizziness and giddiness: Secondary | ICD-10-CM | POA: Insufficient documentation

## 2011-10-16 DIAGNOSIS — G8929 Other chronic pain: Secondary | ICD-10-CM

## 2011-10-16 DIAGNOSIS — IMO0001 Reserved for inherently not codable concepts without codable children: Secondary | ICD-10-CM | POA: Insufficient documentation

## 2011-10-16 DIAGNOSIS — R51 Headache: Secondary | ICD-10-CM | POA: Insufficient documentation

## 2011-10-16 DIAGNOSIS — E039 Hypothyroidism, unspecified: Secondary | ICD-10-CM | POA: Insufficient documentation

## 2011-10-16 MED ORDER — DIAZEPAM 5 MG PO TABS: 5.0000 mg | ORAL_TABLET | Freq: Four times a day (QID) | ORAL | Status: AC | PRN

## 2011-10-16 NOTE — ED Provider Notes (Signed)
History    Scribed for Hurman Horn, MD, the patient was seen in room MH07/MH07. This chart was scribed by Katha Cabal.   CSN: [id number] Arrival date & time: 10/16/2011  9:49 PM   First MD Initiated Contact with Patient 10/16/11 2222      Chief Complaint  Patient presents with  . Sore Throat    (Consider location/radiation/quality/duration/timing/severity/associated sxs/prior treatment) Patient is a 35 y.o. female presenting with pharyngitis and abdominal pain. The history is provided by the patient. No language interpreter was used.  Sore Throat This is a new problem. Episode onset: 4-5 days ago  The problem occurs constantly. The problem has been gradually worsening. Associated symptoms include abdominal pain and headaches. Pertinent negatives include no chest pain and no shortness of breath. Associated symptoms comments: Fever, congestion, myalgias, congestion, rhinorrhea, cough.  Patient reports that multiple family members with same symptoms.. Exacerbated by: movement  Treatments tried: Tramadol  The treatment provided mild relief.  Abdominal Pain The primary symptoms of the illness include abdominal pain and fever. The primary symptoms of the illness do not include shortness of breath, vomiting, diarrhea, dysuria or vaginal bleeding. Episode onset: 4-5 days ago  The onset of the illness was gradual.  The abdominal pain has been gradually worsening since its onset. The abdominal pain is located in the LLQ.  Additional symptoms associated with the illness include chills. Symptoms associated with the illness do not include back pain. Significant associated medical issues do not include diabetes or HIV. Associated medical issues comments: fibromyalgia .    Patient has history of fibromyalgia and takes Dilaudid (rarely) and Tramadol as needed for pain.  Patient has chronic headaches, abdominal pain and body aches that are well controlled.    This 35 year old female has chronic  generalized pain chronic abdominal pain and chronic headaches on a weekly if not daily basis at baseline for years which are controlled with tramadol and Dilaudid, she now presents for flulike illness with multiple family members at the same symptoms with fever nasal congestion cough sore throat runny nose worse than usual body aches for the patient worse than usual abdominal pain the patient had typical exacerbation of headache for the patient without rash altered mental status or shortness of breath.  Past Medical History  Diagnosis Date  . HSV-1 (herpes simplex virus 1) infection   . Hypothyroid   . Fibromyalgia   . Migraines   . Nausea & vomiting   . Shingles     Past Surgical History  Procedure Date  . Abdominal hysterectomy 2002    RSO   . Tubal ligation   . Cholecystectomy     Family History  Problem Relation Age of Onset  . Hypertension Mother   . Diabetes Mother     History  Substance Use Topics  . Smoking status: Never Smoker   . Smokeless tobacco: Not on file  . Alcohol Use: Yes    OB History    Grav Para Term Preterm Abortions TAB SAB Ect Mult Living                  Review of Systems  Constitutional: Positive for fever, chills and appetite change.       10 Systems reviewed and are negative for acute change except as noted in the HPI.  HENT: Positive for congestion and sore throat.   Eyes: Negative for discharge and redness.  Respiratory: Positive for cough. Negative for shortness of breath.   Cardiovascular: Negative for  chest pain.  Gastrointestinal: Positive for abdominal pain. Negative for vomiting and diarrhea.  Genitourinary: Negative for dysuria and vaginal bleeding.  Musculoskeletal: Positive for myalgias. Negative for back pain.  Skin: Negative for rash.  Neurological: Positive for light-headedness and headaches. Negative for syncope, weakness and numbness.  Psychiatric/Behavioral: Negative for hallucinations and confusion.       No behavior  change.    Allergies  Aspirin; Codeine; Lortab; Penicillins; and Tylenol  Home Medications   Current Outpatient Rx  Name Route Sig Dispense Refill  . PULMICORT FLEXHALER IN Inhalation Inhale 2 puffs into the lungs daily as needed. Shortness of breath and wheezing     . CYCLOBENZAPRINE HCL 10 MG PO TABS Oral Take 10 mg by mouth daily as needed. For muscle spasms     . DIPHENHYDRAMINE HCL 25 MG PO CAPS Oral Take 25 mg by mouth at bedtime as needed. For allergic reaction     . HYDROMORPHONE HCL 4 MG PO TABS Oral Take 2 mg by mouth every 4 (four) hours as needed. For pain    . HYDROXYZINE HCL 50 MG PO TABS Oral Take 100-150 mg by mouth at bedtime.     Marland Kitchen LEVOTHYROXINE SODIUM 50 MCG PO TABS Oral Take 50 mcg by mouth daily.      . TRAMADOL HCL 50 MG PO TABS Oral Take 50-100 mg by mouth every 6 (six) hours as needed. pain    . DIAZEPAM 5 MG PO TABS Oral Take 1 tablet (5 mg total) by mouth every 6 (six) hours as needed for anxiety (spasms). 10 tablet 0    BP 127/81  Pulse 100  Temp(Src) 99.1 F (37.3 C) (Oral)  Resp 20  Ht 5' (1.524 m)  Wt 156 lb (70.761 kg)  BMI 30.47 kg/m2  SpO2 99%  Physical Exam  Nursing note and vitals reviewed. Constitutional:       Awake, alert, nontoxic appearance.  HENT:  Head: Atraumatic.  Right Ear: Tympanic membrane normal.  Left Ear: Tympanic membrane normal.  Mouth/Throat: Posterior oropharyngeal erythema (mildly) present. No oropharyngeal exudate.  Eyes: EOM are normal. Pupils are equal, round, and reactive to light. Right eye exhibits no discharge. Left eye exhibits no discharge.  Neck: Neck supple.       Diffuse soft tissue tenderness   Cardiovascular: Normal rate and regular rhythm.   No murmur heard. Pulmonary/Chest: Effort normal and breath sounds normal. No stridor. No respiratory distress. She has no wheezes. She has no rales. She exhibits tenderness.  Abdominal: Soft. Bowel sounds are normal. She exhibits no mass. There is tenderness. There  is no rebound.       Mildly tender throughout, moderately tender in lower abdomen   Musculoskeletal: She exhibits tenderness.       Baseline ROM, no obvious new focal weakness, diffuse back tenderness   Lymphadenopathy:    She has no cervical adenopathy.  Neurological:       Mental status and motor strength appears baseline for patient and situation.  Skin: No rash noted.  Psychiatric: She has a normal mood and affect.   neurologic exam includes normal mental status major cranial nerves are intact pupils reactive extraocular movements intact peripheral visual fields full to confrontation no facial asymmetry motor strength is 5 out of 5 without pronator drift in all 4 extremities she has normal light touch in all 4 extremities normal finger to nose testing bilaterally.  ED Course  Procedures (including critical care time)   DIAGNOSTIC STUDIES: Oxygen Saturation is  99% on room air, normal by my interpretation.     COORDINATION OF CARE: 11:31 PM  Physical exam complete.  Discussed plan of treatment with patient.  Will provide patient with muscle relaxer's.  Work note.    LABS / RADIOLOGY:   Labs Reviewed - No data to display No results found.       MDM  I doubt any other EMC precluding discharge at this time including, but not necessarily limited to the following:SBI, peritonitis.        IMPRESSION: 1. Influenza   2. Chronic abdominal pain   3. Chronic headaches     Scribe I personally performed the services described in this documentation, which was scribed in my presence. The recorded information has been reviewed and considered.             Hurman Horn, MD 10/17/11 [phone]

## 2011-10-16 NOTE — ED Notes (Signed)
Pt states she has had a sore throat since yesterday. Also c/o right ear pain and headache. Also c/o LLQ pain since Wed. Denies Urinary s/s or discharge. No diarrhea. Also feel light-headed and dizzy.

## 2012-01-23 ENCOUNTER — Other Ambulatory Visit: Payer: Self-pay | Admitting: Women's Health

## 2012-01-23 DIAGNOSIS — N83202 Unspecified ovarian cyst, left side: Secondary | ICD-10-CM

## 2012-01-23 NOTE — Progress Notes (Signed)
Telephone call from the patient, questioning when to have followup ultrasound, Will call and schedule. History of  thickwalled cystic irregular  30 x 28 x 25 mm,  thickwalled cystic 20 x 14 x 14 mm in the left adnexa also a 18 x 13 x 9 mm. right adnexal negative noted on pelvic US 6/12. Had gallbladder surgery summer of 2012.

## 2012-02-16 ENCOUNTER — Ambulatory Visit: Payer: Medicaid Other | Admitting: Women's Health

## 2012-02-16 ENCOUNTER — Ambulatory Visit (INDEPENDENT_AMBULATORY_CARE_PROVIDER_SITE_OTHER): Payer: Medicaid Other

## 2012-02-16 ENCOUNTER — Other Ambulatory Visit: Payer: Medicaid Other

## 2012-02-16 ENCOUNTER — Other Ambulatory Visit: Payer: Self-pay | Admitting: Gynecology

## 2012-02-16 ENCOUNTER — Encounter: Payer: Self-pay | Admitting: Women's Health

## 2012-02-16 ENCOUNTER — Ambulatory Visit (INDEPENDENT_AMBULATORY_CARE_PROVIDER_SITE_OTHER): Payer: Medicaid Other | Admitting: Women's Health

## 2012-02-16 DIAGNOSIS — N83209 Unspecified ovarian cyst, unspecified side: Secondary | ICD-10-CM

## 2012-02-16 DIAGNOSIS — N8300 Follicular cyst of ovary, unspecified side: Secondary | ICD-10-CM

## 2012-02-16 DIAGNOSIS — R1032 Left lower quadrant pain: Secondary | ICD-10-CM

## 2012-02-16 DIAGNOSIS — N831 Corpus luteum cyst of ovary, unspecified side: Secondary | ICD-10-CM

## 2012-02-16 DIAGNOSIS — R112 Nausea with vomiting, unspecified: Secondary | ICD-10-CM | POA: Insufficient documentation

## 2012-02-16 DIAGNOSIS — N83201 Unspecified ovarian cyst, right side: Secondary | ICD-10-CM

## 2012-02-16 NOTE — Patient Instructions (Signed)
Fiber Content in Foods Drinking plenty of fluids and consuming foods high in fiber can help with constipation. See the list below for the fiber content of some common foods. Starches and Grains / Dietary Fiber (g)  Cheerios, 1 cup / 3 g   Kellogg's Corn Flakes, 1 cup / 0.7 g   Rice Krispies, 1  cup / 0.3 g   Quaker Oat Life Cereal,  cup / 2.1 g   Oatmeal, instant (cooked),  cup / 2 g   Kellogg's Frosted Mini Wheats, 1 cup / 5.1 g   Rice, brown, long-grain (cooked), 1 cup / 3.5 g   Rice, white, long-grain (cooked), 1 cup / 0.6 g   Macaroni, cooked, enriched, 1 cup / 2.5 g  Legumes / Dietary Fiber (g)  Beans, baked, canned, plain or vegetarian,  cup / 5.2 g   Beans, kidney, canned,  cup / 6.8 g   Beans, pinto, dried (cooked),  cup / 7.7 g   Beans, pinto, canned,  cup / 5.5 g  Breads and Crackers / Dietary Fiber (g)  Graham crackers, plain or honey, 2 squares / 0.7 g   Saltine crackers, 3 squares / 0.3 g   Pretzels, plain, salted, 10 pieces / 1.8 g   Bread, whole-wheat, 1 slice / 1.9 g   Bread, white, 1 slice / 0.7 g   Bread, raisin, 1 slice / 1.2 g   Bagel, plain, 3 oz / 2 g   Tortilla, flour, 1 oz / 0.9 g   Tortilla, corn, 1 small / 1.5 g   Bun, hamburger or hotdog, 1 small / 0.9 g  Fruits / Dietary Fiber (g)  Apple, raw with skin, 1 medium / 4.4 g   Applesauce, sweetened,  cup / 1.5 g   Banana,  medium / 1.5 g   Grapes, 10 grapes / 0.4 g   Orange, 1 small / 2.3 g   Raisin, 1.5 oz / 1.6 g   Melon, 1 cup / 1.4 g  Vegetables / Dietary Fiber (g)  Green beans, canned,  cup / 1.3 g   Carrots (cooked),  cup / 2.3 g   Broccoli (cooked),  cup / 2.8 g   Peas, frozen (cooked),  cup / 4.4 g   Potatoes, mashed,  cup / 1.6 g   Lettuce, 1 cup / 0.5 g   Corn, canned,  cup / 1.6 g   Tomato,  cup / 1.1 g  Document Released: 03/12/2007 Document Revised: 10/13/2011 Document Reviewed: 05/07/2007 [facility] Patient Information 2012  [place], [place].Constipation in Adults Constipation is having fewer than 2 bowel movements per week. Usually, the stools are hard. As we grow older, constipation is more common. If you try to fix constipation with laxatives, the problem may get worse. This is because laxatives taken over a long period of time make the colon muscles weaker. A low-fiber diet, not taking in enough fluids, and taking some medicines may make these problems worse. MEDICATIONS THAT MAY CAUSE CONSTIPATION  Water pills (diuretics).   Calcium channel blockers (used to control blood pressure and for the heart).   Certain pain medicines (narcotics).   Anticholinergics.   Anti-inflammatory agents.   Antacids that contain aluminum.  DISEASES THAT CONTRIBUTE TO CONSTIPATION  Diabetes.   Parkinson's disease.   Dementia.   Stroke.   Depression.   Illnesses that cause problems with salt and water metabolism.  HOME CARE INSTRUCTIONS   Constipation is usually best cared for without medicines. Increasing dietary  fiber and eating more fruits and vegetables is the best way to manage constipation.   Slowly increase fiber intake to 25 to 38 grams per day. Whole grains, fruits, vegetables, and legumes are good sources of fiber. A dietitian can further help you incorporate high-fiber foods into your diet.   Drink enough water and fluids to keep your urine clear or pale yellow.   A fiber supplement may be added to your diet if you cannot get enough fiber from foods.   Increasing your activities also helps improve regularity.   Suppositories, as suggested by your caregiver, will also help. If you are using antacids, such as aluminum or calcium containing products, it will be helpful to switch to products containing magnesium if your caregiver says it is okay.   If you have been given a liquid injection (enema) today, this is only a temporary measure. It should not be relied on for treatment of longstanding (chronic)  constipation.   Stronger measures, such as magnesium sulfate, should be avoided if possible. This may cause uncontrollable diarrhea. Using magnesium sulfate may not allow you time to make it to the bathroom.  SEEK IMMEDIATE MEDICAL CARE IF:   There is bright red blood in the stool.   The constipation stays for more than 4 days.   There is belly (abdominal) or rectal pain.   You do not seem to be getting better.   You have any questions or concerns.  MAKE SURE YOU:   Understand these instructions.   Will watch your condition.   Will get help right away if you are not doing well or get worse.  Document Released: 07/22/2004 Document Revised: 10/13/2011 Document Reviewed: 09/27/2011 [facility] Patient Information 2012 [place], [place].

## 2012-02-16 NOTE — Progress Notes (Signed)
Patient ID: Sara Garrison, female   DOB: [birth year], 36 y.o.   MRN: [id number] Presents for an ultrasound due to abdominal pain and history of ovarian cysts. Had a cholecystectomy July of 2012 and has continued with nausea, vomiting and abdominal  pain. Prior to cholecystectomy  had vomiting most days of the week, and now has vomiting approximately 3 times per week. States has always had problems with constipation and that problem has continued. States has only 1-2 bowel movements per week. Had a normal colonoscopy in 2012. States has increased abdominal cramping with ovulation monthly.  Exam: Appears uncomfortable, bowel sounds present in all 4 quadrants. Generalized tenderness throughout the abdomen.  Ultrasound: Status post hysterectomy. Right adnexal negative, RSO. Left ovary thickwalled cystic mass reticular echo pattern 34 x 28 x 32 mm no vascular flow seen. Follicle 23x19 mm. Minimal fluid in the cul-de-sac seen.  Ovarian cyst Chronic constipation  Plan: Encourage increased fiber rich foods to greater than 25 g per day, discussed constipation prevention, will try MiraLax daily, return to office in 2-3 months for [place]  to check for resolution of ovarian cyst. Note for work given. Continue care with primary care for constipation issues.

## 2012-03-25 ENCOUNTER — Encounter (HOSPITAL_BASED_OUTPATIENT_CLINIC_OR_DEPARTMENT_OTHER): Payer: Self-pay | Admitting: *Deleted

## 2012-03-25 ENCOUNTER — Emergency Department (HOSPITAL_BASED_OUTPATIENT_CLINIC_OR_DEPARTMENT_OTHER)
Admission: EM | Admit: 2012-03-25 | Discharge: 2012-03-25 | Disposition: A | Discharge status: Home / Self Care | Payer: Medicaid Other | Attending: Emergency Medicine | Admitting: Emergency Medicine

## 2012-03-25 ENCOUNTER — Emergency Department (HOSPITAL_BASED_OUTPATIENT_CLINIC_OR_DEPARTMENT_OTHER): Payer: Medicaid Other

## 2012-03-25 DIAGNOSIS — IMO0001 Reserved for inherently not codable concepts without codable children: Secondary | ICD-10-CM | POA: Insufficient documentation

## 2012-03-25 DIAGNOSIS — B009 Herpesviral infection, unspecified: Secondary | ICD-10-CM | POA: Insufficient documentation

## 2012-03-25 DIAGNOSIS — M542 Cervicalgia: Secondary | ICD-10-CM | POA: Insufficient documentation

## 2012-03-25 DIAGNOSIS — E039 Hypothyroidism, unspecified: Secondary | ICD-10-CM | POA: Insufficient documentation

## 2012-03-25 MED ORDER — PREDNISONE 10 MG PO TABS: ORAL_TABLET | ORAL | Status: DC

## 2012-03-25 MED ORDER — METHYLPREDNISOLONE SODIUM SUCC 125 MG IJ SOLR
125.0000 mg | Freq: Once | INTRAMUSCULAR | Status: AC
  Administered 2012-03-25: 125 mg via INTRAMUSCULAR
  Filled 2012-03-25: qty 2

## 2012-03-25 MED ORDER — DIAZEPAM 5 MG PO TABS: 5.0000 mg | ORAL_TABLET | Freq: Three times a day (TID) | ORAL | Status: AC | PRN

## 2012-03-25 MED ORDER — DIAZEPAM 5 MG PO TABS
10.0000 mg | ORAL_TABLET | Freq: Once | ORAL | Status: DC
  Filled 2012-03-25: qty 2

## 2012-03-25 NOTE — ED Provider Notes (Signed)
Medical screening examination/treatment/procedure(s) were performed by non-physician practitioner and as supervising physician I was immediately available for consultation/collaboration.   Devaughn Savant, MD 03/25/12 2306 

## 2012-03-25 NOTE — ED Notes (Signed)
D/c home with rx x 2 for prednisone and valium- pt has a ride

## 2012-03-25 NOTE — Discharge Instructions (Signed)

## 2012-03-25 NOTE — ED Notes (Signed)
Pt with neck spasms since Wed states that she has been using ice and heat and rx medication

## 2012-03-25 NOTE — ED Provider Notes (Signed)
History     CSN: [id number]  Arrival date & time 03/25/12  1857   First MD Initiated Contact with Patient 03/25/12 2048      Chief Complaint  Patient presents with  . Neck Pain    (Consider location/radiation/quality/duration/timing/severity/associated sxs/prior treatment) Patient is a 36 y.o. female presenting with neck pain. The history is provided by the patient. No language interpreter was used.  Neck Pain  This is a recurrent problem. Episode onset: 5 days. The problem occurs constantly. The problem has not changed since onset.The pain is associated with nothing. There has been no fever. The pain is present in the generalized neck. The quality of the pain is described as aching. The pain is severe. The pain is the same all the time. Stiffness is present all day. She has tried analgesics for the symptoms.  Pt reports she has a history of a pinched nerve in her neck.   Pt has seen Dr. Lovell Sheehan in the past.  Pt has been taking dilaudid and flexeril at home.  Pt concerned that neck is off track.  Pt requesting xrays.    Past Medical History  Diagnosis Date  . HSV-1 (herpes simplex virus 1) infection   . Hypothyroid   . Fibromyalgia   . Migraines   . Nausea & vomiting   . Shingles     Past Surgical History  Procedure Date  . Abdominal hysterectomy 2002    RSO   . Tubal ligation   . Cholecystectomy     Family History  Problem Relation Age of Onset  . Hypertension Mother   . Diabetes Mother     History  Substance Use Topics  . Smoking status: Never Smoker   . Smokeless tobacco: Not on file  . Alcohol Use: Yes    OB History    Grav Para Term Preterm Abortions TAB SAB Ect Mult Living                  Review of Systems  HENT: Positive for neck pain.   All other systems reviewed and are negative.    Allergies  Aspirin; Codeine; Hydrocodone-acetaminophen; Penicillins; and Tylenol  Home Medications   Current Outpatient Rx  Name Route Sig Dispense Refill  .  ALBUTEROL SULFATE HFA 108 (90 BASE) MCG/ACT IN AERS Inhalation Inhale 2 puffs into the lungs every 6 (six) hours as needed. For shortness of breath    . PULMICORT FLEXHALER IN Inhalation Inhale 2 puffs into the lungs daily as needed. Shortness of breath and wheezing     . CYCLOBENZAPRINE HCL 10 MG PO TABS Oral Take 10 mg by mouth daily as needed. For muscle spasms     . HYDROMORPHONE HCL 4 MG PO TABS Oral Take 2-4 mg by mouth every 4 (four) hours as needed. For pain    . HYDROXYZINE HCL 50 MG PO TABS Oral Take 100-150 mg by mouth at bedtime.     Marland Kitchen LEVOTHYROXINE SODIUM 50 MCG PO TABS Oral Take 50 mcg by mouth daily.      Marland Kitchen OVER THE COUNTER MEDICATION Topical Apply 1 patch topically daily as needed. [place] pain patch    . TRAMADOL HCL 50 MG PO TABS Oral Take 50-100 mg by mouth every 6 (six) hours as needed. pain      BP 144/85  Pulse 76  Temp(Src) 97.7 F (36.5 C) (Oral)  Resp 20  SpO2 100%  Physical Exam  Nursing note and vitals reviewed. Constitutional: She appears well-developed  and well-nourished.  HENT:  Head: Normocephalic and atraumatic.  Right Ear: External ear normal.  Left Ear: External ear normal.  Nose: Nose normal.  Mouth/Throat: Oropharynx is clear and moist.  Eyes: Conjunctivae and EOM are normal. Pupils are equal, round, and reactive to light.  Neck: Neck supple. No thyromegaly present.  Cardiovascular: Normal rate.   Pulmonary/Chest: Effort normal.  Abdominal: Soft.  Musculoskeletal: She exhibits tenderness.       Tender anterior and posterior cervical spine  Neurological: She is alert. She has normal reflexes.  Skin: Skin is warm.  Psychiatric: She has a normal mood and affect.    ED Course  Procedures (including critical care time)  Labs Reviewed - No data to display Dg Cervical Spine Complete  03/25/2012  *RADIOLOGY REPORT*  Clinical Data: Neck pain  CERVICAL SPINE - COMPLETE 4+ VIEW  Comparison: 08/21/2008 MRI  Findings: Loss of normal cervical  lordosis with mild kyphosis centered at C4-5.  The cervical thoracic junction is not well visualized.  The inferior aspect of C7 is also obscured by the patient's shoulders.  Within this limitation, maintained vertebral body height.  No acute fracture identified.  No prevertebral soft tissue swelling.  Maintained craniocervical and C1-2 articulations. Lung apices are clear.  IMPRESSION: Loss of normal cervical lordosis is a nonspecific finding that can be secondary to positioning, muscle spasm, or ligamentous injury.  The cervical thoracic junction is obscured by the patient's shoulders.  Recommend a swimmer's view if this is a level of concern.  Original Report Authenticated By: Waneta Martins, M.D.     No diagnosis found.    MDM  Pt given solumedrol IM.  Pt advised to continue current pain medication.   See Dr. Lovell Sheehan for recheck        Lonia Skinner [place], [place] 03/25/12 2206

## 2012-04-23 ENCOUNTER — Ambulatory Visit: Payer: Medicaid Other | Admitting: Women's Health

## 2012-04-23 ENCOUNTER — Other Ambulatory Visit: Payer: Medicaid Other

## 2012-05-03 ENCOUNTER — Other Ambulatory Visit: Payer: Self-pay | Admitting: Neurosurgery

## 2012-05-03 DIAGNOSIS — M542 Cervicalgia: Secondary | ICD-10-CM

## 2012-05-07 ENCOUNTER — Other Ambulatory Visit: Payer: Medicaid Other

## 2012-05-07 ENCOUNTER — Ambulatory Visit: Payer: Medicaid Other | Admitting: Gynecology

## 2012-05-11 ENCOUNTER — Other Ambulatory Visit: Payer: Medicaid Other

## 2012-05-16 ENCOUNTER — Other Ambulatory Visit: Payer: Medicaid Other

## 2012-05-18 ENCOUNTER — Inpatient Hospital Stay: Admission: RE | Admit: 2012-05-18 | Payer: Medicaid Other | Source: Ambulatory Visit

## 2012-05-21 ENCOUNTER — Ambulatory Visit
Admission: RE | Admit: 2012-05-21 | Discharge: 2012-05-21 | Disposition: A | Discharge status: Home / Self Care | Payer: Medicaid Other | Source: Ambulatory Visit | Attending: Neurosurgery | Admitting: Neurosurgery

## 2012-05-21 DIAGNOSIS — M542 Cervicalgia: Secondary | ICD-10-CM

## 2012-10-01 ENCOUNTER — Ambulatory Visit: Payer: Medicaid Other | Admitting: Physical Therapy

## 2012-10-12 ENCOUNTER — Ambulatory Visit: Payer: Medicaid Other | Attending: Neurosurgery | Admitting: Physical Therapy

## 2013-03-21 IMAGING — US US ABDOMEN COMPLETE
1 series · 14 of 25 positions shown · non-contrast
Comparison: CT scan from 03/15/2011

CLINICAL DATA: Abdominal pain.  Nausea vomiting.

ABDOMEN ULTRASOUND
TECHNIQUE: Complete abdominal ultrasound examination was performed
including evaluation of the liver, gallbladder, bile ducts,
pancreas, kidneys, spleen, IVC, and abdominal aorta.

[Series 1: us abdomen complete · 0.32mm/px · 14 of 72 slices shown]
[im 1/72]
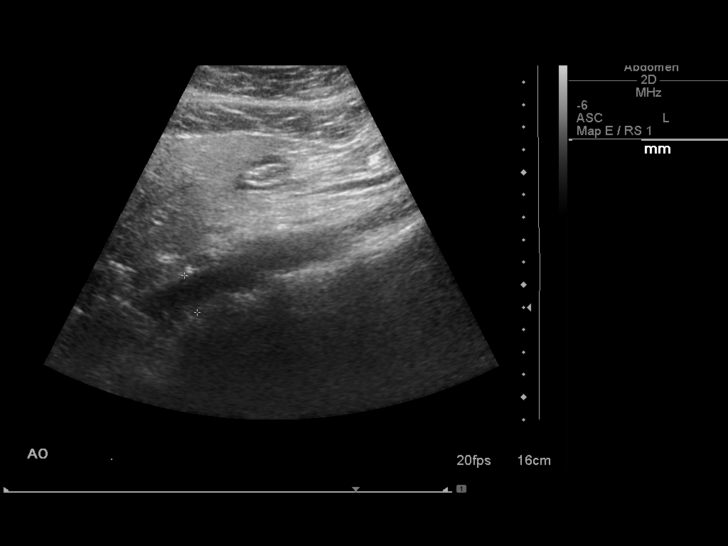
[im 6/72]
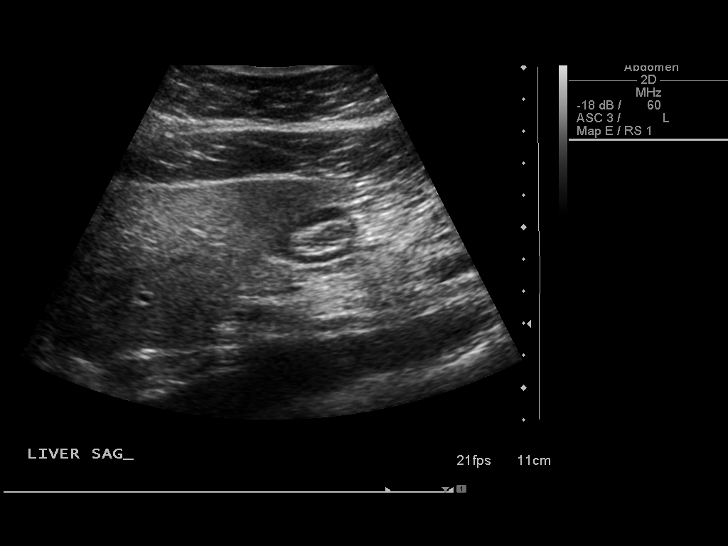
[im 12/72]
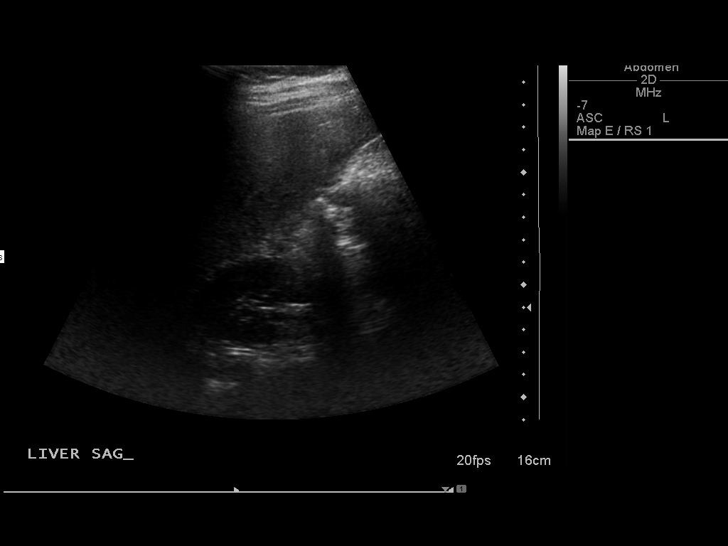
[im 18/72]
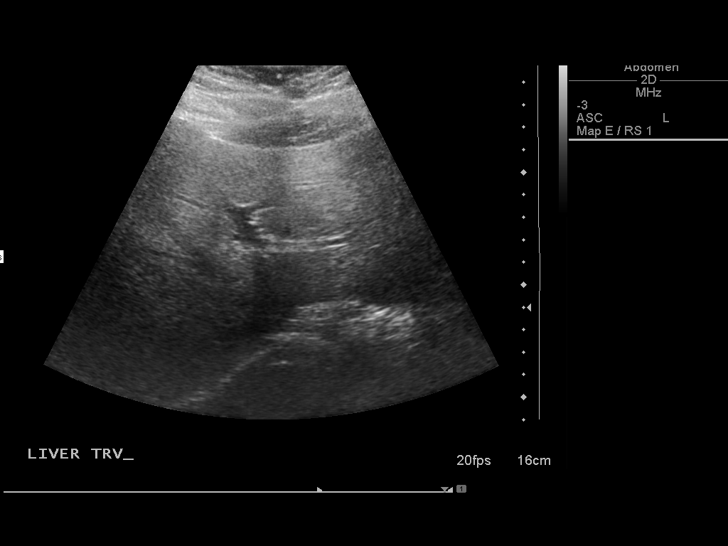
[im 24/72]
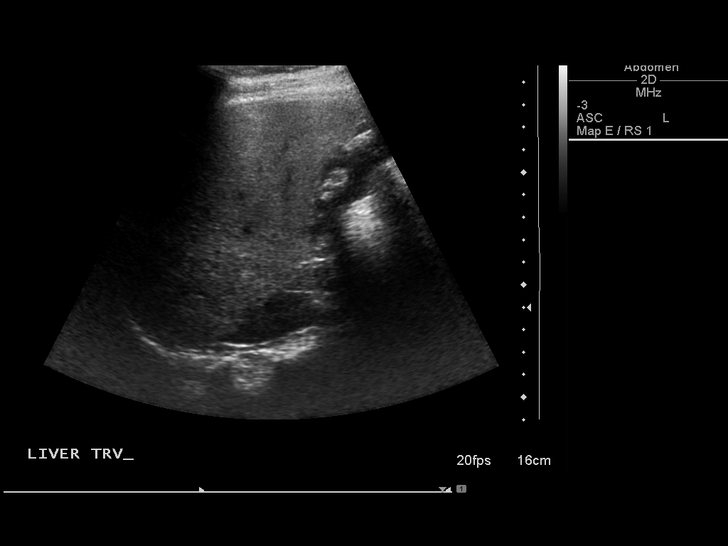
[im 27/72]
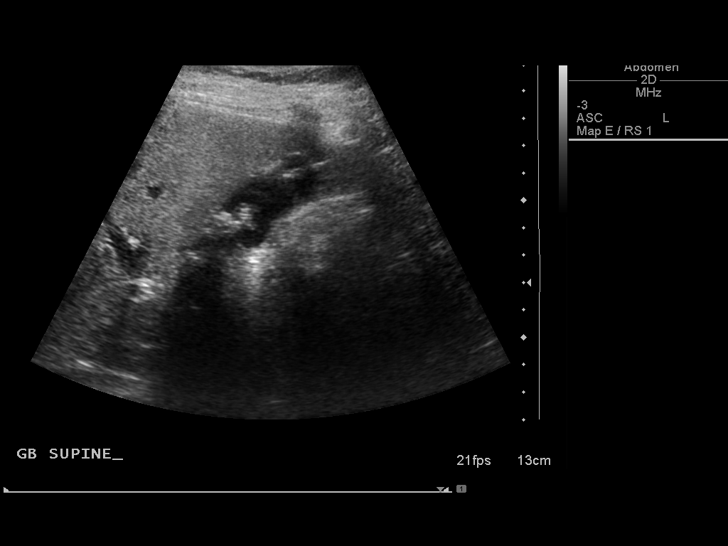
[im 33/72]
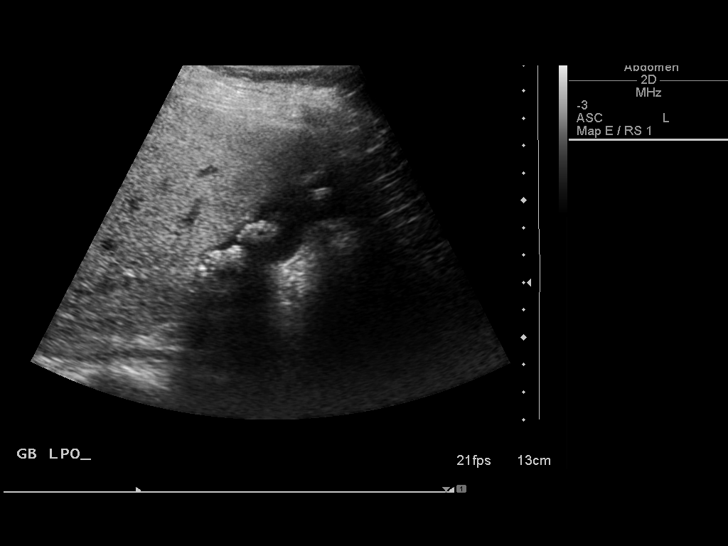
[im 39/72]
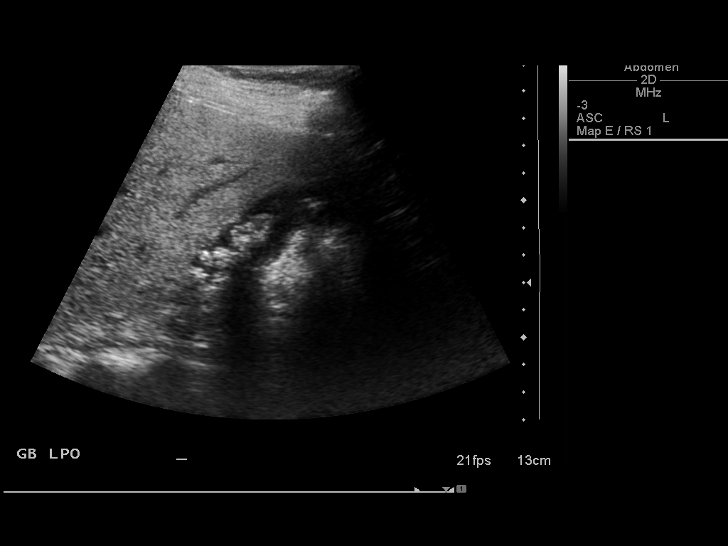
[im 45/72]
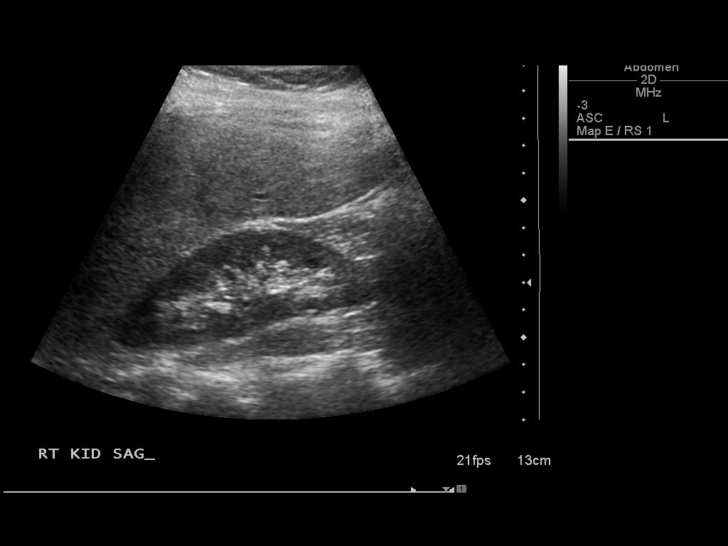
[im 48/72]
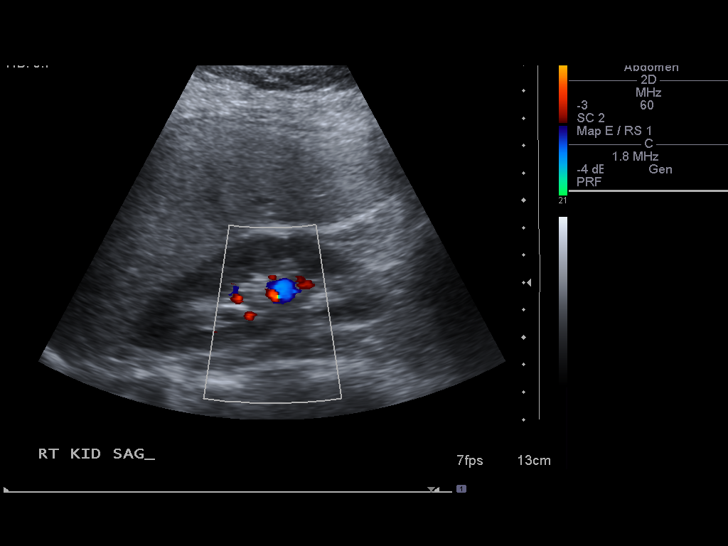
[im 54/72]
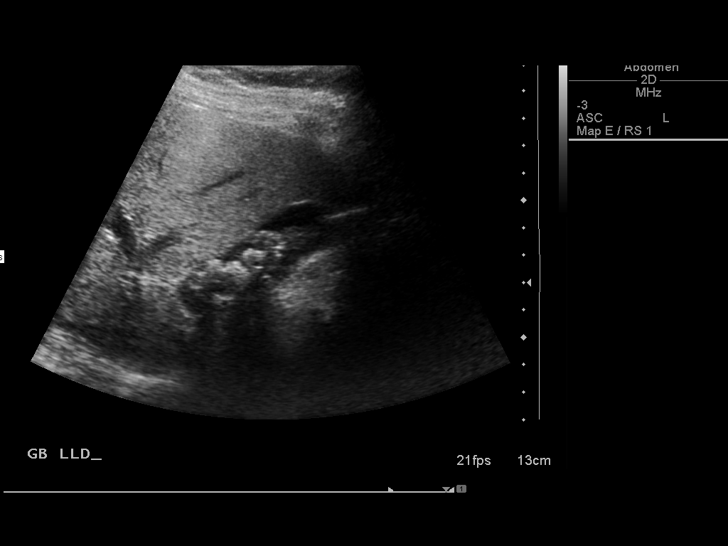
[im 60/72]
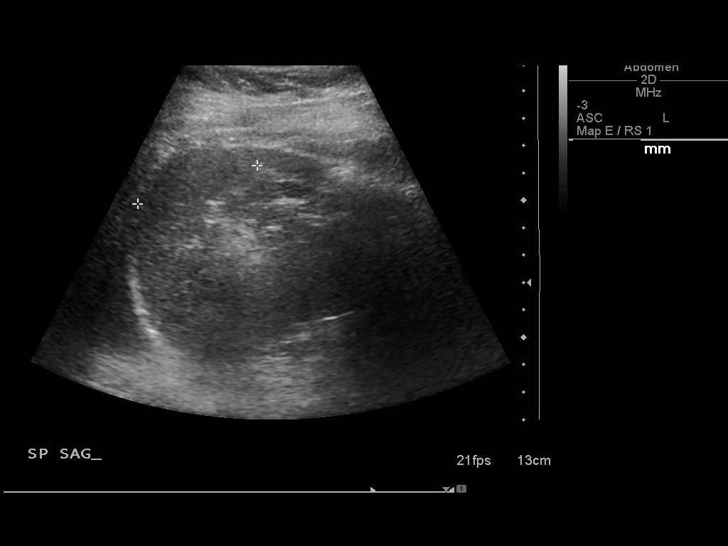
[im 66/72]
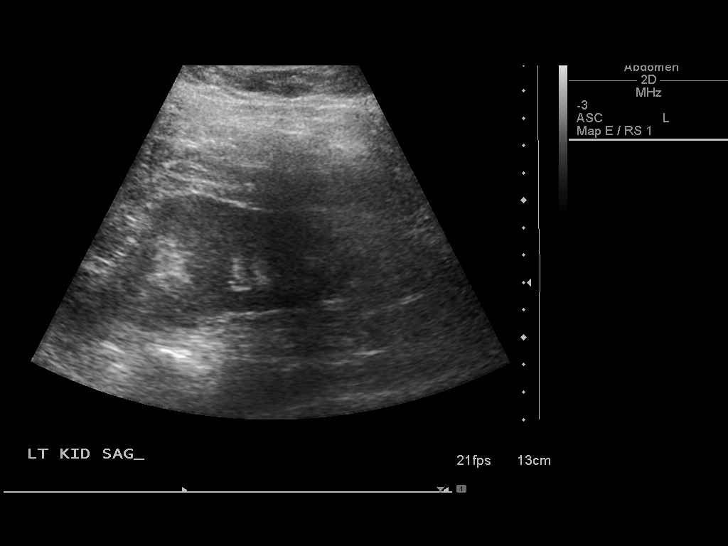
[im 72/72]
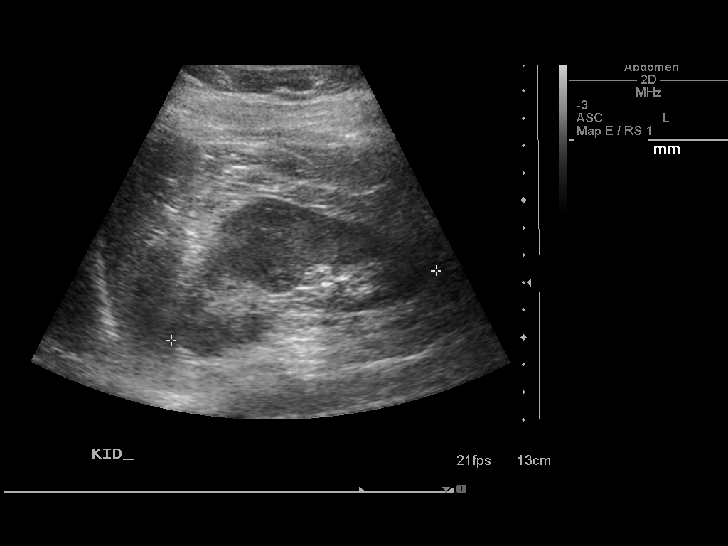

[14 of 25 positions shown; findings below may reference images not displayed]

FINDINGS: Gallbladder:  Numerous stones are seen within the lumen of the
gallbladder.  These are not calcified and are not visible on the
recent CT scan.  Stones measure up to about 1 cm in diameter.  No
evidence for gallbladder wall thickening or pericholecystic fluid.
The sonographer reports no sonographic Murphy's sign.

Common Bile Duct:  Nondilated at 4 mm diameter.

Liver:  Normal.  No focal parenchymal abnormality.  No biliary
dilation.

IVC:  Normal.

Pancreas:  Normal.

Spleen:  Normal.

Right kidney:  10.0 cm in long axis.  Normal.

Left kidney:  10.0 cm in long axis.  Normal.

Abdominal Aorta:  No aneurysm.
IMPRESSION: Cholelithiasis.  Otherwise normal exam.

## 2013-07-11 ENCOUNTER — Encounter (HOSPITAL_COMMUNITY): Payer: Self-pay | Admitting: Emergency Medicine

## 2013-07-11 ENCOUNTER — Emergency Department (HOSPITAL_COMMUNITY): Payer: Medicaid Other

## 2013-07-11 ENCOUNTER — Emergency Department (HOSPITAL_COMMUNITY)
Admission: EM | Admit: 2013-07-11 | Discharge: 2013-07-11 | Disposition: A | Discharge status: Home / Self Care | Payer: Medicaid Other | Attending: Emergency Medicine | Admitting: Emergency Medicine

## 2013-07-11 DIAGNOSIS — R1031 Right lower quadrant pain: Secondary | ICD-10-CM | POA: Insufficient documentation

## 2013-07-11 DIAGNOSIS — IMO0001 Reserved for inherently not codable concepts without codable children: Secondary | ICD-10-CM | POA: Insufficient documentation

## 2013-07-11 DIAGNOSIS — M797 Fibromyalgia: Secondary | ICD-10-CM

## 2013-07-11 DIAGNOSIS — G43909 Migraine, unspecified, not intractable, without status migrainosus: Secondary | ICD-10-CM | POA: Insufficient documentation

## 2013-07-11 DIAGNOSIS — R42 Dizziness and giddiness: Secondary | ICD-10-CM | POA: Insufficient documentation

## 2013-07-11 DIAGNOSIS — IMO0002 Reserved for concepts with insufficient information to code with codable children: Secondary | ICD-10-CM | POA: Insufficient documentation

## 2013-07-11 DIAGNOSIS — M79609 Pain in unspecified limb: Secondary | ICD-10-CM | POA: Insufficient documentation

## 2013-07-11 DIAGNOSIS — R197 Diarrhea, unspecified: Secondary | ICD-10-CM | POA: Insufficient documentation

## 2013-07-11 DIAGNOSIS — Z8619 Personal history of other infectious and parasitic diseases: Secondary | ICD-10-CM | POA: Insufficient documentation

## 2013-07-11 DIAGNOSIS — R1011 Right upper quadrant pain: Secondary | ICD-10-CM | POA: Insufficient documentation

## 2013-07-11 DIAGNOSIS — R112 Nausea with vomiting, unspecified: Secondary | ICD-10-CM | POA: Insufficient documentation

## 2013-07-11 DIAGNOSIS — G8929 Other chronic pain: Secondary | ICD-10-CM | POA: Insufficient documentation

## 2013-07-11 DIAGNOSIS — R209 Unspecified disturbances of skin sensation: Secondary | ICD-10-CM | POA: Insufficient documentation

## 2013-07-11 DIAGNOSIS — R079 Chest pain, unspecified: Secondary | ICD-10-CM | POA: Insufficient documentation

## 2013-07-11 DIAGNOSIS — M25579 Pain in unspecified ankle and joints of unspecified foot: Secondary | ICD-10-CM | POA: Insufficient documentation

## 2013-07-11 DIAGNOSIS — Z9089 Acquired absence of other organs: Secondary | ICD-10-CM | POA: Insufficient documentation

## 2013-07-11 DIAGNOSIS — Z880 Allergy status to penicillin: Secondary | ICD-10-CM | POA: Insufficient documentation

## 2013-07-11 DIAGNOSIS — R5381 Other malaise: Secondary | ICD-10-CM | POA: Insufficient documentation

## 2013-07-11 DIAGNOSIS — M79672 Pain in left foot: Secondary | ICD-10-CM

## 2013-07-11 DIAGNOSIS — Z3202 Encounter for pregnancy test, result negative: Secondary | ICD-10-CM | POA: Insufficient documentation

## 2013-07-11 DIAGNOSIS — Z79899 Other long term (current) drug therapy: Secondary | ICD-10-CM | POA: Insufficient documentation

## 2013-07-11 DIAGNOSIS — E039 Hypothyroidism, unspecified: Secondary | ICD-10-CM | POA: Insufficient documentation

## 2013-07-11 LAB — POCT PREGNANCY, URINE: Preg Test, Ur: NEGATIVE

## 2013-07-11 LAB — POCT I-STAT TROPONIN I: Troponin i, poc: 0.01 ng/mL (ref 0.00–0.08)

## 2013-07-11 LAB — URINALYSIS, ROUTINE W REFLEX MICROSCOPIC
Bilirubin Urine: NEGATIVE
Leukocytes, UA: NEGATIVE
Nitrite: NEGATIVE
Specific Gravity, Urine: 1.028 (ref 1.005–1.030)
pH: 5 (ref 5.0–8.0)

## 2013-07-11 LAB — COMPREHENSIVE METABOLIC PANEL
Albumin: 3.9 g/dL (ref 3.5–5.2)
BUN: 12 mg/dL (ref 6–23)
Creatinine, Ser: 0.71 mg/dL (ref 0.50–1.10)
Total Bilirubin: 0.5 mg/dL (ref 0.3–1.2)
Total Protein: 7.4 g/dL (ref 6.0–8.3)

## 2013-07-11 LAB — CBC WITH DIFFERENTIAL/PLATELET
Basophils Absolute: 0 10*3/uL (ref 0.0–0.1)
HCT: 37.6 % (ref 36.0–46.0)
Hemoglobin: 13.3 g/dL (ref 12.0–15.0)
Lymphocytes Relative: 37 % (ref 12–46)
Monocytes Absolute: 0.6 10*3/uL (ref 0.1–1.0)
Neutro Abs: 6.5 10*3/uL (ref 1.7–7.7)
Neutrophils Relative %: 55 % (ref 43–77)
RDW: 12.7 % (ref 11.5–15.5)
WBC: 11.8 10*3/uL — ABNORMAL HIGH (ref 4.0–10.5)

## 2013-07-11 LAB — LIPASE, BLOOD: Lipase: 15 U/L (ref 11–59)

## 2013-07-11 MED ORDER — FENTANYL CITRATE 0.05 MG/ML IJ SOLN
50.0000 ug | Freq: Once | INTRAMUSCULAR | Status: AC
  Administered 2013-07-11: 50 ug via INTRAVENOUS
  Filled 2013-07-11: qty 2

## 2013-07-11 MED ORDER — ONDANSETRON HCL 4 MG/2ML IJ SOLN
4.0000 mg | Freq: Once | INTRAMUSCULAR | Status: AC
  Administered 2013-07-11: 4 mg via INTRAVENOUS
  Filled 2013-07-11: qty 2

## 2013-07-11 MED ORDER — IOHEXOL 300 MG/ML  SOLN
100.0000 mL | Freq: Once | INTRAMUSCULAR | Status: AC | PRN
  Administered 2013-07-11: 100 mL via INTRAVENOUS

## 2013-07-11 MED ORDER — SODIUM CHLORIDE 0.9 % IV BOLUS (SEPSIS)
1000.0000 mL | Freq: Once | INTRAVENOUS | Status: AC
  Administered 2013-07-11: 1000 mL via INTRAVENOUS

## 2013-07-11 MED ORDER — IOHEXOL 300 MG/ML  SOLN
25.0000 mL | INTRAMUSCULAR | Status: AC
  Administered 2013-07-11: 25 mL via ORAL

## 2013-07-11 MED ORDER — PROMETHAZINE HCL 25 MG RE SUPP: 25.0000 mg | Freq: Four times a day (QID) | RECTAL | Status: DC | PRN

## 2013-07-11 NOTE — ED Provider Notes (Signed)
CSN: [id number]     Arrival date & time 07/11/13  1312 History   First MD Initiated Contact with Patient 07/11/13 1507     Chief Complaint  Patient presents with  . Abdominal Pain   (Consider location/radiation/quality/duration/timing/severity/associated sxs/prior Treatment) The history is provided by the patient. No language interpreter was used.  Sara Garrison is a 37 year old female with past medical history of herpes simplex virus type I, fibromyalgia, hypothyroidism presenting to the emergency department with abdominal pain that has been ongoing for the past 2 years, has been ongoing since the patient has a cholecystectomy on 05/11/2013, with worsening within the past 3 days. Patient reported the pain is localized to the right upper quadrant, right lower quadrant described as a sharp pain that shoots down her right leg, reported that this is an intermittent pain with constant turning and burning sensations in her abdomen localize the right side. Patient reported that when she has episodes of sharp shooting pain it triggers nausea. Patient reported that the pain is radiating down her right leg, right thigh reported that she is experiencing mild numbness to the right leg. Patient reported the pain worsens when she eats, nothing makes the pain better. Patient reports that she's been using medicated patches, heating pads have not been helping. When asked whether or not patient is followed with gastroenterology, patient reported that she has not seen gastroenterology more than 6 months. Patient reported that she's been feeling mildly weak. Reported that she had mild chest discomforts, reported as a burning sensation the Center for chest without radiation, spasms in nature lasting approximate 1-2 minutes in length. Patient reported this occurred yesterday and today. Denied fever, chills, diarrhea, melena, hematochezia, difficulty eating, changes to eating, shortness of breath, difficulty breathing,  urinary symptoms, numbness, tingling, bowel and urinary incontinence. PCP Dr. Mort Sawyers  Past Medical History  Diagnosis Date  . HSV-1 (herpes simplex virus 1) infection   . Hypothyroid   . Fibromyalgia   . Migraines   . Nausea & vomiting   . Shingles    Past Surgical History  Procedure Laterality Date  . Abdominal hysterectomy  2002    RSO   . Tubal ligation    . Cholecystectomy     Family History  Problem Relation Age of Onset  . Hypertension Mother   . Diabetes Mother    History  Substance Use Topics  . Smoking status: Never Smoker   . Smokeless tobacco: Not on file  . Alcohol Use: Yes   OB History   Grav Para Term Preterm Abortions TAB SAB Ect Mult Living                 Review of Systems  Constitutional: Negative for fever and chills.  HENT: Negative for sore throat, trouble swallowing, neck pain and neck stiffness.   Eyes: Negative for visual disturbance.  Respiratory: Negative for cough, chest tightness and shortness of breath.   Cardiovascular: Positive for chest pain.  Gastrointestinal: Positive for nausea, vomiting, abdominal pain and diarrhea.  Genitourinary: Negative for hematuria, decreased urine volume, vaginal bleeding, vaginal discharge, difficulty urinating, vaginal pain and pelvic pain.  Musculoskeletal: Negative for back pain.  Neurological: Positive for dizziness and weakness.  All other systems reviewed and are negative.    Allergies  Aspirin; Codeine; Hydrocodone-acetaminophen; Penicillins; and Tylenol  Home Medications   Current Outpatient Rx  Name  Route  Sig  Dispense  Refill  . albuterol (PROVENTIL HFA;VENTOLIN HFA) 108 (90 BASE) MCG/ACT inhaler  Inhalation   Inhale 2 puffs into the lungs every 6 (six) hours as needed. For shortness of breath         . azithromycin (ZITHROMAX) 250 MG tablet   Oral   Take 250 mg by mouth daily. For 4 days. Started on sep 2. On day 1 of therapy         . Budesonide (PULMICORT FLEXHALER  IN)   Inhalation   Inhale 2 puffs into the lungs daily as needed. Shortness of breath and wheezing          . HYDROmorphone (DILAUDID) 4 MG tablet   Oral   Take 2-4 mg by mouth every 4 (four) hours as needed. For pain         . hydrOXYzine (ATARAX) 50 MG tablet   Oral   Take 50-100 mg by mouth 2 (two) times daily. 50 mg in the morning and 100 mg at bedtime         . levothyroxine (SYNTHROID, LEVOTHROID) 50 MCG tablet   Oral   Take 25 mcg by mouth daily.          Marland Kitchen OVER THE COUNTER MEDICATION   Topical   Apply 1 patch topically daily as needed. Salon Pas pain patch         . traMADol (ULTRAM) 50 MG tablet   Oral   Take 50-100 mg by mouth every 6 (six) hours as needed. pain         . EXPIRED: promethazine (PHENERGAN) 12.5 MG tablet   Oral   Take 1 tablet (12.5 mg total) by mouth every 6 (six) hours as needed for nausea.   20 tablet   0   . promethazine (PHENERGAN) 25 MG suppository   Rectal   Place 1 suppository (25 mg total) rectally every 6 (six) hours as needed for nausea.   12 each   0    BP 121/73  Pulse 61  Temp(Src) 97.9 F (36.6 C) (Oral)  Resp 16  SpO2 100% Physical Exam  Nursing note and vitals reviewed. Constitutional: She is oriented to person, place, and time. She appears well-developed and well-nourished. No distress.  HENT:  Head: Normocephalic and atraumatic.  Mouth/Throat: Oropharynx is clear and moist. No oropharyngeal exudate.  Eyes: Conjunctivae and EOM are normal. Pupils are equal, round, and reactive to light. Right eye exhibits no discharge. Left eye exhibits no discharge.  Neck: Normal range of motion. Neck supple.  Negative neck stiffness Negative nuchal rigidity Negative lymphadenopathy  Cardiovascular: Normal rate, regular rhythm and normal heart sounds.  Exam reveals no friction rub.   No murmur heard. Pulses:      Radial pulses are 2+ on the right side, and 2+ on the left side.       Dorsalis pedis pulses are 2+ on the  right side, and 2+ on the left side.  Pulmonary/Chest: Effort normal and breath sounds normal. No respiratory distress. She has no wheezes. She has no rales. She exhibits tenderness.    Pain reproducible upon palpation to the chest wall, identified to the left side of the chest and Center for chest  Abdominal: Soft. Normal appearance and bowel sounds are normal. She exhibits no distension. There is tenderness in the right upper quadrant and right lower quadrant. There is tenderness at McBurney's point and positive Murphy's sign. There is no rebound and no guarding.    Tenderness upon palpation to the right upper quadrant and right lower quadrant Positive Murphy's sign Positive psoas  and obturator  Musculoskeletal: Normal range of motion.  Lymphadenopathy:    She has no cervical adenopathy.  Neurological: She is alert and oriented to person, place, and time. She exhibits normal muscle tone. Coordination normal.  Strength 5+/5+ with resistance, equal distribution identified upper and lower extremities bilaterally Sensation intact upper and lower tremors bilaterally with differentiation to sharp and dull touch, sensation intact to upper right thigh and leg.  Skin: Skin is warm and dry. No rash noted. She is not diaphoretic. No erythema.  Psychiatric: She has a normal mood and affect. Her behavior is normal. Thought content normal.    ED Course  Procedures (including critical care time)  7:00 PM Nurse reported to this provider the patient refused to leave without getting a left foot x-ray secondary to left foot pain that has been ongoing for the past couple months. When assessed patient reported that she had left foot pain comment the lateral aspect of the left foot. Pain upon palpation. Negative signs of deformity, ecchymosis. Patient denied any type of fall or injury. When regarding shoes as to why patient is wearing high heels, patient reported that the pain is worse when she wears flats or  sneakers.  7:09 PM Nurse verified that the troponin was negative elevation, results were not passing over from lab.  7:15 PM Discussed lab findings with patient. When asked about pain patient reported that the pain has subsided and that she has been feeling better. Patient reported that the nausea has improved. Patient was given saltine and water to eat and drink to see if patient was able to keep the food down.   8:30 PM patient has been able to tolerate crackers and saltines by mouth. No episodes of emesis while in ED setting. Patient reported that pain has improved to the abdomen. Discussed plan for discharge, patient agreed.   Date: 07/11/2013  Rate: 68  Rhythm: normal sinus rhythm  QRS Axis: normal  Intervals: normal  ST/T Wave abnormalities: normal  Conduction Disutrbances:none  Narrative Interpretation:   Old EKG Reviewed: none available   Labs Review Labs Reviewed  COMPREHENSIVE METABOLIC PANEL - Abnormal; Notable for the following:    Glucose, Bld 109 (*)    All other components within normal limits  CBC WITH DIFFERENTIAL - Abnormal; Notable for the following:    WBC 11.8 (*)    Lymphs Abs 4.4 (*)    All other components within normal limits  URINALYSIS, ROUTINE W REFLEX MICROSCOPIC - Abnormal; Notable for the following:    Color, Urine AMBER (*)    APPearance HAZY (*)    All other components within normal limits  LIPASE, BLOOD  POCT PREGNANCY, URINE  POCT I-STAT TROPONIN I   Imaging Review Ct Abdomen Pelvis W Contrast  07/11/2013   *RADIOLOGY REPORT*  Clinical Data: Lower quadrant pain.  Nausea and vomiting.  CT ABDOMEN AND PELVIS WITH CONTRAST  Technique:  Multidetector CT imaging of the abdomen and pelvis was performed following the standard protocol during bolus administration of intravenous contrast.  Contrast: OMNIPAQUE IOHEXOL 300 MG/ML  SOLN  Comparison: CT abdomen and pelvis 07/15/2011.  Findings: The lung bases are clear.  There is no pleural or  pericardial effusion.  The patient is status post cholecystectomy.  The liver is low attenuating consistent with fatty infiltration.  No focal liver lesion is identified.  The spleen, adrenal glands, pancreas and kidneys appear normal.  The appendix is well visualized and normal in appearance.  The stomach and small  and large bowel appear normal.  The uterus has been removed.  Urinary bladder and adnexa are unremarkable.  There is no focal bony abnormality.  IMPRESSION:  1.  Negative for appendicitis.  No acute finding. 2.  Fatty infiltration of the liver.   Original Report Authenticated By: Holley Dexter, M.D.   Dg Foot Complete Left  07/11/2013   *RADIOLOGY REPORT*  Clinical Data: Lateral left foot pain.  LEFT FOOT - COMPLETE 3+ VIEW  Comparison: None.  Findings: Imaged bones, joints and soft tissues appear normal.  IMPRESSION: Normal study.   Original Report Authenticated By: Holley Dexter, M.D.    MDM   1. Chronic abdominal pain   2. Left foot pain   3. Fibromyalgia      Patient presenting to emergency department with abdominal pain that has been ongoing for the past 2 years with worsening over the past 3 days. Pain mainly localized to the right upper quadrant with new onset of right lower quadrant discomfort. Patient had cholecystectomy performed on 05/12/2011, reported that she's been having right eye discomfort since the surgery. When asked regarding gastroenterology followup patient reported that she has not seen GI longer than 6 months. Alert and oriented. Negative acute abdomen, negative peritoneal signs. Discomfort upon palpation to right upper quadrant with positive Murphy sign. Pain upon palpation to the right lower quadrant, positive psoas and obturator sign, positive McBurney's point. Soft, bowel sounds normal active in all 4 quadrants. Strength upper and lower extremities identified. Sensation intact with differentiation to sharp and dull touch. Pulses palpable, distal and  proximal bilaterally. Mild discomfort upon palpation to the lateral aspect of the left foot near base of the small toe. Negative signs of deformities, ecchymosis, inflammation, swelling identified. CBC negative findings. CMP negative findings. Urine negative findings for infection. Urine pregnancy negative. Lipase negative elevation. EKG negative ischemic identifications. Troponins negative elevation. CT abdomen and pelvis with contrast negative findings for acute appendicitis, negative abnormalities identified. Left foot x-ray negative abnormalities identified, negative acute abnormalities noted. Patient stable, afebrile. Patient tolerated by mouth with water and saltine crackers, no emesis while in ED setting. Pain controlled in ED setting. Patient placed in Ace bandage for comfort of the left foot. Discussed case and lab findings with Dr. Freida Busman, as per physician cleared patient for discharge. Discharge patient with small dose of pain medications, antibiotics. Discussed with patient to be followed up with [facility] urology regarding chronic abdominal pain. Referred patient to orthopedics regarding left foot discomfort. Discussed with patient to rest and stay hydrated. Discussed with patient proper diet choices. Discussed with patient to closely monitor symptoms and if symptoms are to worsen or change report back to emergency department - return instructions given. Patient agreed to plan care, understood, all questions answered.  Raymon Mutton, PA-C 07/12/13 [phone]

## 2013-07-11 NOTE — ED Notes (Signed)
Pt c/o right sided flank and/or abd pain x months intermittently; pt sts some nausea

## 2013-07-12 NOTE — ED Provider Notes (Signed)
Medical screening examination/treatment/procedure(s) were performed by non-physician practitioner and as supervising physician I was immediately available for consultation/collaboration.   Charles B. Bernette Mayers, MD 07/12/13 [phone]

## 2013-07-15 ENCOUNTER — Ambulatory Visit (INDEPENDENT_AMBULATORY_CARE_PROVIDER_SITE_OTHER): Payer: Medicaid Other | Admitting: Women's Health

## 2013-07-15 ENCOUNTER — Encounter: Payer: Self-pay | Admitting: Women's Health

## 2013-07-15 VITALS — BP 124/82 | Ht 60.25 in | Wt 164.0 lb

## 2013-07-15 DIAGNOSIS — M797 Fibromyalgia: Secondary | ICD-10-CM

## 2013-07-15 DIAGNOSIS — Z01419 Encounter for gynecological examination (general) (routine) without abnormal findings: Secondary | ICD-10-CM

## 2013-07-15 DIAGNOSIS — R1032 Left lower quadrant pain: Secondary | ICD-10-CM

## 2013-07-15 DIAGNOSIS — IMO0001 Reserved for inherently not codable concepts without codable children: Secondary | ICD-10-CM

## 2013-07-15 DIAGNOSIS — E039 Hypothyroidism, unspecified: Secondary | ICD-10-CM

## 2013-07-15 NOTE — Progress Notes (Signed)
Sara Garrison [birth year] [id number]    History:    The patient presents for breast, pelvic exam and left lower quadrant pain. History of normal Paps. TAH with RSO 2002 for fibroids and pelvic pain. Was seen in the ER last week for severe abdominal pain, CT the appendix was normal on 07/11/13. Cholecystectomy 2012. Normal colonoscopy 2012, years with problems with constipation, nausea/vomiting with weight stable. Has had chronic low abdominal pain for years, fibromyalgia, recurrent blisters on lower back unresponsive to Valtrex and negative for shingles. Reports pain 10 last week, currently 6, right lower quadrant pain has resolved. Same partner. HSV 1 history.  Past medical history, past surgical history, family history and social history were all reviewed and documented in the EPIC chart.  Hypothyroid on Synthroid primary care manages labs and meds. Sons ages 63 and 89. Reports unable to work due to chronic abdominal pain and fibromyalgia.   Exam:  Filed Vitals:   07/15/13 1622  BP: 124/82    General appearance:  Normal Head/Neck:  Normal, without cervical or supraclavicular adenopathy. Thyroid:  Symmetrical, normal in size, without palpable masses or nodularity. Respiratory  Effort:  Normal  Auscultation:  Clear without wheezing or rhonchi Cardiovascular  Auscultation:  Regular rate, without rubs, murmurs or gallops  Edema/varicosities:  Not grossly evident Abdominal  Soft,nontender, without masses, guarding or rebound.  Liver/spleen:  No organomegaly noted  Hernia:  None appreciated  Skin  Inspection:  Grossly normal  Palpation:  Grossly normal Neurologic/psychiatric  Orientation:  Normal with appropriate conversation.  Mood/affect:  Normal  Genitourinary    Breasts: Examined lying and sitting.     Right: Without masses, retractions, discharge or axillary adenopathy.     Left: Without masses, retractions, discharge or axillary adenopathy.   Inguinal/mons:  Normal  without inguinal adenopathy  External genitalia:  Normal  BUS/Urethra/Skene's glands:  Normal  Bladder:  Normal  Vagina:  Normal  Cervix/uterus:  Absent   Adnexa/parametria:     Rt: Without masses or tenderness.   Lt: Without masses or tenderness.  Anus and perineum: Normal  Digital rectal exam: Normal sphincter tone without palpated masses or tenderness  Assessment/Plan:  37 y.o.SBF G2P2  for rest and pelvic exam and recurrent left lower quadrant pain.  Left lower quadrant pain Hypothyroid/fibromyalgia-primary care labs and meds  Plan: Ultrasound will schedule, SBE's, baseline mammogram, annual at 38. Reviewed importance of gradual increase of regular exercise, calcium rich foods, vitamin D 2000 daily. Reviewed possibly decreasing gluten foods and diet, history of bloating, hives, blisters. Will followup with primary care for celiac testing.    [facility], 5:16 PM 07/15/2013

## 2013-07-15 NOTE — Patient Instructions (Addendum)

## 2013-07-18 ENCOUNTER — Encounter: Payer: Self-pay | Admitting: Women's Health

## 2013-07-18 ENCOUNTER — Ambulatory Visit (INDEPENDENT_AMBULATORY_CARE_PROVIDER_SITE_OTHER): Payer: Medicaid Other

## 2013-07-18 ENCOUNTER — Ambulatory Visit (INDEPENDENT_AMBULATORY_CARE_PROVIDER_SITE_OTHER): Payer: Medicaid Other | Admitting: Women's Health

## 2013-07-18 DIAGNOSIS — R1032 Left lower quadrant pain: Secondary | ICD-10-CM

## 2013-07-18 DIAGNOSIS — IMO0002 Reserved for concepts with insufficient information to code with codable children: Secondary | ICD-10-CM | POA: Insufficient documentation

## 2013-07-18 DIAGNOSIS — N83209 Unspecified ovarian cyst, unspecified side: Secondary | ICD-10-CM

## 2013-07-18 NOTE — Patient Instructions (Signed)
Ovarian Cyst  The ovaries are small organs that are on each side of the uterus. The ovaries are the organs that produce the female hormones, estrogen and progesterone. An ovarian cyst is a sac filled with fluid that can vary in its size. It is normal for a small cyst to form in women who are in the childbearing age and who have menstrual periods. This type of cyst is called a follicle cyst that becomes an ovulation cyst (corpus luteum cyst) after it produces the women's egg. It later goes away on its own if the woman does not become pregnant. There are other kinds of ovarian cysts that may cause problems and may need to be treated. The most serious problem is a cyst with cancer. It should be noted that menopausal women who have an ovarian cyst are at a higher risk of it being a cancer cyst. They should be evaluated very quickly, thoroughly and followed closely. This is especially true in menopausal women because of the high rate of ovarian cancer in women in menopause.  CAUSES AND TYPES OF OVARIAN CYSTS:   FUNCTIONAL CYST: The follicle/corpus luteum cyst is a functional cyst that occurs every month during ovulation with the menstrual cycle. They go away with the next menstrual cycle if the woman does not get pregnant. Usually, there are no symptoms with a functional cyst.   ENDOMETRIOMA CYST: This cyst develops from the lining of the uterus tissue. This cyst gets in or on the ovary. It grows every month from the bleeding during the menstrual period. It is also called a "chocolate cyst" because it becomes filled with blood that turns brown. This cyst can cause pain in the lower abdomen during intercourse and with your menstrual period.   CYSTADENOMA CYST: This cyst develops from the cells on the outside of the ovary. They usually are not cancerous. They can get very big and cause lower abdomen pain and pain with intercourse. This type of cyst can twist on itself, cut off its blood supply and cause severe pain. It  also can easily rupture and cause a lot of pain.   DERMOID CYST: This type of cyst is sometimes found in both ovaries. They are found to have different kinds of body tissue in the cyst. The tissue includes skin, teeth, hair, and/or cartilage. They usually do not have symptoms unless they get very big. Dermoid cysts are rarely cancerous.   POLYCYSTIC OVARY: This is a rare condition with hormone problems that produces many small cysts on both ovaries. The cysts are follicle-like cysts that never produce an egg and become a corpus luteum. It can cause an increase in body weight, infertility, acne, increase in body and facial hair and lack of menstrual periods or rare menstrual periods. Many women with this problem develop type 2 diabetes. The exact cause of this problem is unknown. A polycystic ovary is rarely cancerous.   THECA LUTEIN CYST: Occurs when too much hormone (human chorionic gonadotropin) is produced and over-stimulates the ovaries to produce an egg. They are frequently seen when doctors stimulate the ovaries for invitro-fertilization (test tube babies).   LUTEOMA CYST: This cyst is seen during pregnancy. Rarely it can cause an obstruction to the birth canal during labor and delivery. They usually go away after delivery.  SYMPTOMS    Pelvic pain or pressure.   Pain during sexual intercourse.   Increasing girth (swelling) of the abdomen.   Abnormal menstrual periods.   Increasing pain with menstrual periods.     You stop having menstrual periods and you are not pregnant.  DIAGNOSIS   The diagnosis can be made during:   Routine or annual pelvic examination (common).   Ultrasound.   X-ray of the pelvis.   CT Scan.   MRI.   Blood tests.  TREATMENT    Treatment may only be to follow the cyst monthly for 2 to 3 months with your caregiver. Many go away on their own, especially functional cysts.   May be aspirated (drained) with a long needle with ultrasound, or by laparoscopy (inserting a tube into  the pelvis through a small incision).   The whole cyst can be removed by laparoscopy.   Sometimes the cyst may need to be removed through an incision in the lower abdomen.   Hormone treatment is sometimes used to help dissolve certain cysts.   Birth control pills are sometimes used to help dissolve certain cysts.  HOME CARE INSTRUCTIONS   Follow your caregiver's advice regarding:   Medicine.   Follow up visits to evaluate and treat the cyst.   You may need to come back or make an appointment with another caregiver, to find the exact cause of your cyst, if your caregiver is not a gynecologist.   Get your yearly and recommended pelvic examinations and Pap tests.   Let your caregiver know if you have had an ovarian cyst in the past.  SEEK MEDICAL CARE IF:    Your periods are late, irregular, they stop, or are painful.   Your stomach (abdomen) or pelvic pain does not go away.   Your stomach becomes larger or swollen.   You have pressure on your bladder or trouble emptying your bladder completely.   You have painful sexual intercourse.   You have feelings of fullness, pressure, or discomfort in your stomach.   You lose weight for no apparent reason.   You feel generally ill.   You become constipated.   You lose your appetite.   You develop acne.   You have an increase in body and facial hair.   You are gaining weight, without changing your exercise and eating habits.   You think you are pregnant.  SEEK IMMEDIATE MEDICAL CARE IF:    You have increasing abdominal pain.   You feel sick to your stomach (nausea) and/or vomit.   You develop a fever that comes on suddenly.   You develop abdominal pain during a bowel movement.   Your menstrual periods become heavier than usual.  Document Released: 10/24/2005 Document Revised: 01/16/2012 Document Reviewed: 08/27/2009  [employer] Patient Information 2014 [employer].

## 2013-07-18 NOTE — Progress Notes (Signed)
Patient ID: Sara Garrison, female   DOB: [birth year], 37 y.o.   MRN: [id number] Presents for follow up ultrasound for left sided pelvic pain and dyspareunia. States the pain was a 7/10 prior to exam and increased to 10/10 during and after the exam. TAH RSO 2002 for fibroids.   Transvaginal ultrasound: Rt adnexa is normal. An echogenic, solid, slightly vascular mass seen between vaginal cuff and left ovary. 12 x 10 x 15 mm. No free fluid seen. Compared to transvaginal ultrasound: 10/22/2007 at [facility] hospital vascular cyst, measuring 1 x 1.2 x 1.7 cm.  Left sided solid mass between vaginal cuff and left ovary with minimal change from 2008.  Plan: CA 125, repeat ultrasound in 3 months, keep pain diary.

## 2013-07-23 ENCOUNTER — Encounter (HOSPITAL_BASED_OUTPATIENT_CLINIC_OR_DEPARTMENT_OTHER): Payer: Self-pay | Admitting: *Deleted

## 2013-07-23 ENCOUNTER — Emergency Department (HOSPITAL_BASED_OUTPATIENT_CLINIC_OR_DEPARTMENT_OTHER)
Admission: EM | Admit: 2013-07-23 | Discharge: 2013-07-23 | Disposition: A | Discharge status: Home / Self Care | Payer: Medicaid Other | Attending: Emergency Medicine | Admitting: Emergency Medicine

## 2013-07-23 DIAGNOSIS — R05 Cough: Secondary | ICD-10-CM | POA: Insufficient documentation

## 2013-07-23 DIAGNOSIS — R5381 Other malaise: Secondary | ICD-10-CM | POA: Insufficient documentation

## 2013-07-23 DIAGNOSIS — R112 Nausea with vomiting, unspecified: Secondary | ICD-10-CM | POA: Insufficient documentation

## 2013-07-23 DIAGNOSIS — Z79899 Other long term (current) drug therapy: Secondary | ICD-10-CM | POA: Insufficient documentation

## 2013-07-23 DIAGNOSIS — IMO0001 Reserved for inherently not codable concepts without codable children: Secondary | ICD-10-CM | POA: Insufficient documentation

## 2013-07-23 DIAGNOSIS — J029 Acute pharyngitis, unspecified: Secondary | ICD-10-CM | POA: Insufficient documentation

## 2013-07-23 DIAGNOSIS — H9209 Otalgia, unspecified ear: Secondary | ICD-10-CM | POA: Insufficient documentation

## 2013-07-23 DIAGNOSIS — Z880 Allergy status to penicillin: Secondary | ICD-10-CM | POA: Insufficient documentation

## 2013-07-23 DIAGNOSIS — G839 Paralytic syndrome, unspecified: Secondary | ICD-10-CM | POA: Insufficient documentation

## 2013-07-23 DIAGNOSIS — R51 Headache: Secondary | ICD-10-CM | POA: Insufficient documentation

## 2013-07-23 DIAGNOSIS — R509 Fever, unspecified: Secondary | ICD-10-CM | POA: Insufficient documentation

## 2013-07-23 DIAGNOSIS — R059 Cough, unspecified: Secondary | ICD-10-CM | POA: Insufficient documentation

## 2013-07-23 DIAGNOSIS — B9789 Other viral agents as the cause of diseases classified elsewhere: Secondary | ICD-10-CM | POA: Insufficient documentation

## 2013-07-23 DIAGNOSIS — Z8679 Personal history of other diseases of the circulatory system: Secondary | ICD-10-CM | POA: Insufficient documentation

## 2013-07-23 DIAGNOSIS — IMO0002 Reserved for concepts with insufficient information to code with codable children: Secondary | ICD-10-CM | POA: Insufficient documentation

## 2013-07-23 DIAGNOSIS — Z8619 Personal history of other infectious and parasitic diseases: Secondary | ICD-10-CM | POA: Insufficient documentation

## 2013-07-23 DIAGNOSIS — R599 Enlarged lymph nodes, unspecified: Secondary | ICD-10-CM | POA: Insufficient documentation

## 2013-07-23 DIAGNOSIS — B349 Viral infection, unspecified: Secondary | ICD-10-CM

## 2013-07-23 MED ORDER — ONDANSETRON 4 MG PO TBDP
4.0000 mg | ORAL_TABLET | Freq: Once | ORAL | Status: AC
  Administered 2013-07-23: 4 mg via ORAL
  Filled 2013-07-23: qty 1

## 2013-07-23 MED ORDER — TRAMADOL HCL 50 MG PO TABS: 50.0000 mg | ORAL_TABLET | Freq: Four times a day (QID) | ORAL | Status: DC | PRN

## 2013-07-23 MED ORDER — ONDANSETRON HCL 4 MG PO TABS: 4.0000 mg | ORAL_TABLET | Freq: Four times a day (QID) | ORAL | Status: DC

## 2013-07-23 NOTE — ED Notes (Signed)
Sore throat, ear pain, runny nose and body aches onset yesterday am states her son was sick over the weekend and thinks she caught from him denies any n/v/d

## 2013-07-23 NOTE — ED Provider Notes (Signed)
CSN: [id number]     Arrival date & time 07/23/13  0857 History   First MD Initiated Contact with Patient 07/23/13 [phone]     Chief Complaint  Patient presents with  . sore throat ear pain     HPI  Patient's son was ill several days ago. She is developed similar symptoms. Sore throat and headache starting yesterday right ear pain. Nausea and vomiting this morning. No abdominal pain. Occasional cough. Not short of breath. Diffuse bodyaches. No rash. No joint pain. No exacerbating or alleviating factors.  Past Medical History  Diagnosis Date  . HSV-1 (herpes simplex virus 1) infection   . Hypothyroid   . Fibromyalgia   . Migraines   . Nausea & vomiting   . Shingles    Past Surgical History  Procedure Laterality Date  . Abdominal hysterectomy  2002    RSO   . Tubal ligation    . Cholecystectomy     Family History  Problem Relation Age of Onset  . Hypertension Mother   . Diabetes Mother    History  Substance Use Topics  . Smoking status: Never Smoker   . Smokeless tobacco: Never Used  . Alcohol Use: Yes     Comment: occassional   OB History   Grav Para Term Preterm Abortions TAB SAB Ect Mult Living   4 2   2 2    2      Review of Systems  Constitutional: Positive for fever and fatigue.  HENT: Positive for sore throat. Negative for mouth sores, trouble swallowing, neck pain, neck stiffness and voice change.   Eyes: Negative for photophobia.  Respiratory: Positive for cough. Negative for shortness of breath, wheezing and stridor.   Cardiovascular: Negative for chest pain.  Gastrointestinal: Positive for nausea and vomiting. Negative for abdominal pain.  Endocrine: Negative for polyuria.  Genitourinary: Negative for dysuria.  Musculoskeletal: Positive for myalgias.  Skin: Negative for rash.  Neurological: Positive for headaches. Negative for dizziness.    Allergies  Aspirin; Codeine; Hydrocodone-acetaminophen; Penicillins; and Tylenol  Home Medications   Current  Outpatient Rx  Name  Route  Sig  Dispense  Refill  . albuterol (PROVENTIL HFA;VENTOLIN HFA) 108 (90 BASE) MCG/ACT inhaler   Inhalation   Inhale 2 puffs into the lungs every 6 (six) hours as needed. For shortness of breath         . Budesonide (PULMICORT FLEXHALER IN)   Inhalation   Inhale 2 puffs into the lungs daily as needed. Shortness of breath and wheezing          . HYDROmorphone (DILAUDID) 4 MG tablet   Oral   Take 2-4 mg by mouth every 4 (four) hours as needed. For pain         . hydrOXYzine (ATARAX) 50 MG tablet   Oral   Take 50-100 mg by mouth 2 (two) times daily. 50 mg in the morning and 100 mg at bedtime         . levothyroxine (SYNTHROID, LEVOTHROID) 50 MCG tablet   Oral   Take 25 mcg by mouth daily.          . ondansetron (ZOFRAN) 4 MG tablet   Oral   Take 1 tablet (4 mg total) by mouth every 6 (six) hours.   12 tablet   0   . traMADol (ULTRAM) 50 MG tablet   Oral   Take 50-100 mg by mouth every 6 (six) hours as needed. pain         .  traMADol (ULTRAM) 50 MG tablet   Oral   Take 1 tablet (50 mg total) by mouth every 6 (six) hours as needed for pain.   10 tablet   0    BP 135/90  Pulse 88  Temp(Src) 98.7 F (37.1 C) (Oral)  Resp 20  SpO2 99% Physical Exam  Constitutional: She is oriented to person, place, and time. She appears well-developed and well-nourished. No distress.  HENT:  Mouth/Throat: Posterior oropharyngeal erythema present. No oropharyngeal exudate or tonsillar abscesses.  No vesicles  Eyes: Conjunctivae are normal. Pupils are equal, round, and reactive to light. No scleral icterus.  Neck: Normal range of motion. Neck supple. No thyromegaly present.  Supple neck. Bilateral anterior cervical adenopathy.  Cardiovascular: Normal rate and regular rhythm.  Exam reveals no gallop and no friction rub.   No murmur heard. Pulmonary/Chest: Effort normal and breath sounds normal. No respiratory distress. She has no wheezes. She has no  rales.  Clear lungs no increased worker breathing no adventitial breath sounds wheezing or prolongation  Abdominal: Soft. Bowel sounds are normal. She exhibits no distension. There is no tenderness. There is no rebound.  Musculoskeletal: Normal range of motion.  Neurological: She is alert and oriented to person, place, and time.  Skin: Skin is warm and dry. No rash noted.  Psychiatric: She has a normal mood and affect. Her behavior is normal.    ED Course  Procedures (including critical care time) Labs Review Labs Reviewed  RAPID STREP SCREEN   Imaging Review No results found.  MDM   1. Viral syndrome   2. Viral pharyngitis    Strep is negative. Plan is expectant management. Ultram for pain with consideration for narcotic Tylenol and aspirin allergies. Zofran for nausea. Rest, fluid hydration.    Claudean Kinds, MD 07/23/13 [phone]

## 2013-07-26 ENCOUNTER — Emergency Department (HOSPITAL_COMMUNITY)
Admission: EM | Admit: 2013-07-26 | Discharge: 2013-07-26 | Disposition: A | Discharge status: Home / Self Care | Payer: Medicaid Other | Source: Home / Self Care

## 2013-08-06 ENCOUNTER — Ambulatory Visit: Payer: Medicaid Other | Admitting: Neurology

## 2013-08-06 ENCOUNTER — Encounter: Payer: Self-pay | Admitting: Neurology

## 2013-08-23 ENCOUNTER — Encounter: Payer: Self-pay | Admitting: Women's Health

## 2013-08-23 ENCOUNTER — Ambulatory Visit (INDEPENDENT_AMBULATORY_CARE_PROVIDER_SITE_OTHER): Payer: Medicaid Other | Admitting: Women's Health

## 2013-08-23 DIAGNOSIS — N949 Unspecified condition associated with female genital organs and menstrual cycle: Secondary | ICD-10-CM

## 2013-08-23 DIAGNOSIS — N898 Other specified noninflammatory disorders of vagina: Secondary | ICD-10-CM

## 2013-08-23 DIAGNOSIS — R102 Pelvic and perineal pain: Secondary | ICD-10-CM

## 2013-08-23 LAB — WET PREP FOR TRICH, YEAST, CLUE
Clue Cells Wet Prep HPF POC: NONE SEEN
Trich, Wet Prep: NONE SEEN
WBC, Wet Prep HPF POC: NONE SEEN
Yeast Wet Prep HPF POC: NONE SEEN

## 2013-08-23 MED ORDER — TRAMADOL HCL 50 MG PO TABS: 50.0000 mg | ORAL_TABLET | Freq: Four times a day (QID) | ORAL | Status: DC | PRN

## 2013-08-23 NOTE — Progress Notes (Signed)
Patient ID: Sara Garrison, female   DOB: 09-Sep-1976, 37 y.o.   MRN: [id number] Presents with complaint of left lower quadrant pain that increased in the past day. TAH with RSO in 2002 for fibroids and pelvic pain. Fibromyalgia.  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 Reports the pain as a constant ache but increases in intensity at times. Occasional nausea with no vomiting. Denies constipation, urinary symptoms, discharge, same partner. Denies a fever.  Exam: Appears uncomfortable, abdomen obese, soft, left lower quadrant tender, no rebound or radiation of pain. No CVAT. External genitalia within normal limits, speculum exam scant discharge no odor erythema noted, wet prep negative. Bimanual slight tenderness left lower quadrant.  Left lower quadrant pain  Plan: Reviewed may be related to hydrosalpinx will keep scheduled ultrasound appointment in December. Ultram 50 mg when necessary #10 given. Has used in the past, aware of addictive properties. Allergy to Tylenol and aspirin. Encouraged increased rest.

## 2013-08-27 ENCOUNTER — Encounter: Payer: Self-pay | Admitting: Neurology

## 2013-08-27 ENCOUNTER — Ambulatory Visit (INDEPENDENT_AMBULATORY_CARE_PROVIDER_SITE_OTHER): Payer: Medicaid Other | Admitting: Neurology

## 2013-08-27 VITALS — BP 126/72 | HR 77 | Ht 61.0 in | Wt 169.0 lb

## 2013-08-27 DIAGNOSIS — M545 Low back pain, unspecified: Secondary | ICD-10-CM | POA: Insufficient documentation

## 2013-08-27 MED ORDER — DULOXETINE HCL 60 MG PO CPEP: 60.0000 mg | ORAL_CAPSULE | Freq: Every day | ORAL | Status: DC

## 2013-08-27 NOTE — Progress Notes (Signed)
[facility]  PATIENT: Sara Garrison DOB: 1976-05-09  HISTORICAL Sara Garrison is a 37 years old right-handed African American female, referred by her primary care physician Dr. Lovell Garrison for evaluation of diffuse body aching pain   She had past medical history of hypertension, migraine, depression anxiety, fibromyalgia, over past 2 years, she been complains of whole body aching pain, also has frequent rash broke out at her low back, she denies incontinence, she has low back pain, going down to of bilateral leg, she also complains of bilateral hands,  and feet paresthesia  She has tried chiropractor, physical therapy without significant improvement, she is taking Dilaudid 4 mg every 4 hours, with mild improvement, she is also taking tramadol 50 mg, without significant improvement of her low back pain.  She complains of constant bilateral plantar feet discomfort and pain, getting worse when bearing weight,  REVIEW OF SYSTEMS: Full 14 system review of systems performed and notable only for fever, chills, weight gain, fatigue, blurred vision, rash, itching, shortness of breath, incontinence, constipation, easy bruising, easy bleeding, feeling hot, feeling cold increased thirst, joints pain, going sweating, Krantz, achy muscles, energy, skin sensitivity, headaches, numbness, weakness, dizziness, insomnia, sleepiness, rashes neck, depression, anxiety, not enough sleep, decreased energy, change in appetite, disinterested in activities.  ALLERGIES: Allergies  Allergen Reactions  . Aspirin Anaphylaxis  . Codeine Anaphylaxis  . Hydrocodone-Acetaminophen Anaphylaxis  . Penicillins Anaphylaxis  . Tylenol [Acetaminophen] Anaphylaxis    HOME MEDICATIONS: Outpatient Prescriptions Prior to Visit  Medication Sig Dispense Refill  . albuterol (PROVENTIL HFA;VENTOLIN HFA) 108 (90 BASE) MCG/ACT inhaler Inhale 2 puffs into the lungs every 6 (six) hours as needed. For shortness of  breath      . Budesonide (PULMICORT FLEXHALER IN) Inhale 2 puffs into the lungs daily as needed. Shortness of breath and wheezing       . HYDROmorphone (DILAUDID) 4 MG tablet Take 2-4 mg by mouth every 4 (four) hours as needed. For pain      . hydrOXYzine (ATARAX) 50 MG tablet Take 50-100 mg by mouth 2 (two) times daily. 50 mg in the morning and 100 mg at bedtime      . levothyroxine (SYNTHROID, LEVOTHROID) 50 MCG tablet Take 25 mcg by mouth daily.       . ondansetron (ZOFRAN) 4 MG tablet Take 1 tablet (4 mg total) by mouth every 6 (six) hours.  12 tablet  0  . traMADol (ULTRAM) 50 MG tablet Take 50-100 mg by mouth every 6 (six) hours as needed. pain      . traMADol (ULTRAM) 50 MG tablet Take 1 tablet (50 mg total) by mouth every 6 (six) hours as needed for pain.  10 tablet  0   No facility-administered medications prior to visit.    PAST MEDICAL HISTORY: Past Medical History  Diagnosis Date  . HSV-1 (herpes simplex virus 1) infection   . Hypothyroid   . Fibromyalgia   . Migraines   . Nausea & vomiting   . Shingles     PAST SURGICAL HISTORY: Past Surgical History  Procedure Laterality Date  . Abdominal hysterectomy  2002    RSO   . Tubal ligation    . Cholecystectomy      FAMILY HISTORY: Family History  Problem Relation Age of Onset  . Hypertension Mother   . Diabetes Mother     SOCIAL HISTORY:  History   Social History  . Marital Status: Single    Spouse Name: N/A  Number of Children: 2  . Years of Education: college   Occupational History  .      not working   Social History Main Topics  . Smoking status: Never Smoker   . Smokeless tobacco: Never Used  . Alcohol Use: 0.6 oz/week    1 Glasses of wine per week     Comment: occassional  . Drug Use: No  . Sexual Activity: Yes    Birth Control/ Protection: Surgical   Other Topics Concern  . Not on file   Social History Narrative   Patient is filing for disability.     Education- some college   Right  handed.   Caffeine- One cups daily.   Patient is single and lives with her children.     PHYSICAL EXAM   Filed Vitals:   08/27/13 1516  BP: 126/72  Pulse: 77  Height: 5\' 1"  (1.549 m)  Weight: 169 lb (76.658 kg)    Body mass index is 31.95 kg/(m^2).   Generalized: In no acute distress  Neck: Supple, no carotid bruits   Cardiac: Regular rate rhythm  Pulmonary: Clear to auscultation bilaterally  Musculoskeletal: No deformity  Neurological examination  Mentation: Alert oriented to time, place, history taking, and causual conversation  Cranial nerve II-XII: Pupils were equal round reactive to light extraocular movements were full, visual field were full on confrontational test. facial sensation and strength were normal. hearing was intact to finger rubbing bilaterally. Uvula tongue midline.  head turning and shoulder shrug and were normal and symmetric.Tongue protrusion into cheek strength was normal.  Motor: normal tone, bulk and strength.  Sensory: Intact to fine touch, pinprick, preserved vibratory sensation, and proprioception at toes.  Coordination: Normal finger to nose, heel-to-shin bilaterally there was no truncal ataxia  Gait: atalgic, cautious gait.  Romberg signs: Negative  Deep tendon reflexes: Brachioradialis 2/2, biceps 2/2, triceps 2/2, patellar 2/2, Achilles 2/2, plantar responses were flexor bilaterally.   DIAGNOSTIC DATA (LABS, IMAGING, TESTING) - I reviewed patient records, labs, notes, testing and imaging myself where available.  Lab Results  Component Value Date   WBC 11.8* 07/11/2013   HGB 13.3 07/11/2013   HCT 37.6 07/11/2013   MCV 82.8 07/11/2013   PLT 286 07/11/2013      Component Value Date/Time   NA 136 07/11/2013 1334   K 3.8 07/11/2013 1334   CL 102 07/11/2013 1334   CO2 24 07/11/2013 1334   GLUCOSE 109* 07/11/2013 1334   BUN 12 07/11/2013 1334   CREATININE 0.71 07/11/2013 1334   CALCIUM 8.5 07/11/2013 1334   PROT 7.4 07/11/2013 1334   ALBUMIN 3.9  07/11/2013 1334   AST 16 07/11/2013 1334   ALT 15 07/11/2013 1334   ALKPHOS 47 07/11/2013 1334   BILITOT 0.5 07/11/2013 1334   GFRNONAA >90 07/11/2013 1334   GFRAA >90 07/11/2013 1334    ASSESSMENT AND PLAN   37 years old right-handed African American female, with 2 years history of diffuse body aching pain, bilateral lower extremity pain, hands, and feet paresthesia, essentially normal neurological examination,   1.  Differentiation diagnosis including fibromyalgia, need to rule out peripheral neuropathy, inflammatory myopathy, polymalgia rheumatica. 2  laboratory evaluation, including inflammatory markers, 3  EMG nerve conduction study. 4  add on Cymbalta 60 mg every day.        Levert Feinstein, M.D. Ph.D.  [facility] [address], [address] [place], [place] [address] [phone]

## 2013-08-28 LAB — THYROID PANEL WITH TSH
Free Thyroxine Index: 1.5 (ref 1.2–4.9)
T3 Uptake Ratio: 27 % (ref 24–39)
T4, Total: 5.6 ug/dL (ref 4.5–12.0)

## 2013-08-28 LAB — COMPREHENSIVE METABOLIC PANEL
ALT: 13 IU/L (ref 0–32)
AST: 15 IU/L (ref 0–40)
Alkaline Phosphatase: 52 IU/L (ref 39–117)
CO2: 24 mmol/L (ref 18–29)
Calcium: 9.6 mg/dL (ref 8.7–10.2)
Creatinine, Ser: 0.65 mg/dL (ref 0.57–1.00)
GFR calc Af Amer: 129 mL/min/{1.73_m2} (ref >59)
Glucose: 103 mg/dL — ABNORMAL HIGH (ref 65–99)
Potassium: 3.9 mmol/L (ref 3.5–5.2)
Sodium: 138 mmol/L (ref 134–144)
Total Protein: 6.7 g/dL (ref 6.0–8.5)

## 2013-08-28 LAB — CBC
Hemoglobin: 12.4 g/dL (ref 11.1–15.9)
Platelets: 312 10*3/uL (ref 150–379)
RBC: 4.36 x10E6/uL (ref 3.77–5.28)

## 2013-08-28 LAB — VITAMIN B12: Vitamin B-12: 620 pg/mL (ref 211–946)

## 2013-08-28 NOTE — Progress Notes (Signed)
Quick Note:  Please call patient, essentially normal lab, other than mildly elevated glucose 103. ______

## 2013-08-29 NOTE — Progress Notes (Signed)
Quick Note:  I called and relayed the results of her labs (normal). Blood sugar slightly elevated. ______

## 2013-09-02 ENCOUNTER — Telehealth: Payer: Self-pay | Admitting: Neurology

## 2013-09-02 NOTE — Telephone Encounter (Signed)
Please call patient for normal lab result.

## 2013-09-03 NOTE — Telephone Encounter (Signed)
Patient called by Sara Garrison

## 2013-09-05 ENCOUNTER — Ambulatory Visit (INDEPENDENT_AMBULATORY_CARE_PROVIDER_SITE_OTHER): Payer: Medicaid Other | Admitting: Neurology

## 2013-09-05 ENCOUNTER — Encounter (INDEPENDENT_AMBULATORY_CARE_PROVIDER_SITE_OTHER): Payer: Self-pay | Admitting: Radiology

## 2013-09-05 DIAGNOSIS — M545 Low back pain, unspecified: Secondary | ICD-10-CM

## 2013-09-05 DIAGNOSIS — IMO0001 Reserved for inherently not codable concepts without codable children: Secondary | ICD-10-CM

## 2013-09-05 DIAGNOSIS — M797 Fibromyalgia: Secondary | ICD-10-CM

## 2013-09-05 DIAGNOSIS — Z0289 Encounter for other administrative examinations: Secondary | ICD-10-CM

## 2013-09-05 DIAGNOSIS — E039 Hypothyroidism, unspecified: Secondary | ICD-10-CM

## 2013-09-05 NOTE — Procedures (Signed)
    [facility]  NCS (NERVE CONDUCTION STUDY) WITH EMG (ELECTROMYOGRAPHY) REPORT   STUDY DATE: 09/05/2013 PATIENT NAME: Sara Garrison DOB: May 31, 1976 MRN: [id number]    TECHNOLOGIST: Kaylyn Lim ELECTROMYOGRAPHER: Levert Feinstein M.D.  CLINICAL INFORMATION:  37 years old [place] American female, with a few month history of diffuse body aching pain, bilateral plantar feet deep achy pain  On exam: Bilateral lower extremity motor strength was normal, sensory was intact to light touch and vibratory sensation, deep tendon reflex was present and symmetric  FINDINGS: NERVE CONDUCTION STUDY:  Right sural sensory response was normal, right peroneal to EDB, and the tibial motor responses were normal.  Right median, ulnar sensory responses were normal.  NEEDLE ELECTROMYOGRAPHY:  Selected needle examination was performed at right lower extremity muscles, and the right lumbosacral paraspinal muscles.  Needle examination of right tibialis anterior, tibialis posterior, medial gastrocnemius, vastus lateralis, biceps femoris long head was normal,  There was no spontaneous activity at the right lumbosacral paraspinal muscles, at the right L4, L5, S1.  IMPRESSION:  This is a normal study. There is no electrodiagnostic evidence of a large fiber peripheral neuropathy, right lumbar radiculopathy, or myopathy.   INTERPRETING PHYSICIAN:   Levert Feinstein M.D. Ph.D. [facility] [address] [place], [place] [address] [phone]

## 2013-09-12 ENCOUNTER — Other Ambulatory Visit: Payer: Self-pay

## 2013-09-13 ENCOUNTER — Telehealth: Payer: Self-pay | Admitting: *Deleted

## 2013-09-19 ENCOUNTER — Ambulatory Visit
Admission: RE | Admit: 2013-09-19 | Discharge: 2013-09-19 | Disposition: A | Discharge status: Home / Self Care | Payer: Medicaid Other | Source: Ambulatory Visit | Attending: Neurology | Admitting: Neurology

## 2013-09-19 DIAGNOSIS — M545 Low back pain: Secondary | ICD-10-CM

## 2013-09-23 NOTE — Progress Notes (Signed)
Quick Note:  Please call patient, MRI lumbar showed degenerative disc disease without significant foraminal or canal stenosis. ______

## 2013-10-16 ENCOUNTER — Other Ambulatory Visit: Payer: Medicaid Other

## 2013-10-16 ENCOUNTER — Ambulatory Visit: Payer: Medicaid Other | Admitting: Women's Health

## 2014-05-21 ENCOUNTER — Encounter (HOSPITAL_COMMUNITY): Payer: Self-pay | Admitting: Emergency Medicine

## 2014-05-21 ENCOUNTER — Emergency Department (HOSPITAL_COMMUNITY)
Admission: EM | Admit: 2014-05-21 | Discharge: 2014-05-21 | Disposition: A | Discharge status: Home / Self Care | Payer: Medicaid Other | Attending: Emergency Medicine | Admitting: Emergency Medicine

## 2014-05-21 DIAGNOSIS — Z880 Allergy status to penicillin: Secondary | ICD-10-CM | POA: Insufficient documentation

## 2014-05-21 DIAGNOSIS — M545 Low back pain, unspecified: Secondary | ICD-10-CM | POA: Diagnosis not present

## 2014-05-21 DIAGNOSIS — Z79899 Other long term (current) drug therapy: Secondary | ICD-10-CM | POA: Diagnosis not present

## 2014-05-21 DIAGNOSIS — Z8619 Personal history of other infectious and parasitic diseases: Secondary | ICD-10-CM | POA: Insufficient documentation

## 2014-05-21 DIAGNOSIS — G43909 Migraine, unspecified, not intractable, without status migrainosus: Secondary | ICD-10-CM | POA: Insufficient documentation

## 2014-05-21 DIAGNOSIS — IMO0002 Reserved for concepts with insufficient information to code with codable children: Secondary | ICD-10-CM | POA: Diagnosis not present

## 2014-05-21 DIAGNOSIS — E039 Hypothyroidism, unspecified: Secondary | ICD-10-CM | POA: Diagnosis not present

## 2014-05-21 DIAGNOSIS — R51 Headache: Secondary | ICD-10-CM | POA: Diagnosis present

## 2014-05-21 MED ORDER — DIPHENHYDRAMINE HCL 25 MG PO CAPS
25.0000 mg | ORAL_CAPSULE | Freq: Once | ORAL | Status: AC
  Administered 2014-05-21: 25 mg via ORAL
  Filled 2014-05-21: qty 1

## 2014-05-21 MED ORDER — METOCLOPRAMIDE HCL 10 MG PO TABS: 10.0000 mg | ORAL_TABLET | Freq: Four times a day (QID) | ORAL | Status: DC | PRN

## 2014-05-21 MED ORDER — METHYLPREDNISOLONE SODIUM SUCC 125 MG IJ SOLR
125.0000 mg | Freq: Once | INTRAMUSCULAR | Status: AC
  Administered 2014-05-21: 125 mg via INTRAVENOUS
  Filled 2014-05-21: qty 2

## 2014-05-21 MED ORDER — METOCLOPRAMIDE HCL 10 MG PO TABS
10.0000 mg | ORAL_TABLET | Freq: Once | ORAL | Status: AC
  Administered 2014-05-21: 10 mg via ORAL
  Filled 2014-05-21: qty 1

## 2014-05-21 MED ORDER — METHOCARBAMOL 500 MG PO TABS
500.0000 mg | ORAL_TABLET | Freq: Once | ORAL | Status: AC
  Administered 2014-05-21: 500 mg via ORAL
  Filled 2014-05-21: qty 1

## 2014-05-21 MED ORDER — METHOCARBAMOL 500 MG PO TABS: 500.0000 mg | ORAL_TABLET | Freq: Three times a day (TID) | ORAL | Status: DC | PRN

## 2014-05-21 MED ORDER — ONDANSETRON HCL 4 MG/2ML IJ SOLN
4.0000 mg | Freq: Once | INTRAMUSCULAR | Status: AC
  Administered 2014-05-21: 4 mg via INTRAVENOUS
  Filled 2014-05-21: qty 2

## 2014-05-21 NOTE — ED Notes (Signed)
Dr Yelverton at bedside.  

## 2014-05-21 NOTE — ED Notes (Signed)
Pt to ED for evaluation of a headache for the past 3 days, admits to sensitivity to light and sound as well as N/V- last vomited around 4am.  Pt admits to taking Tramadol at home without relief.  Alert and oriented X 4 at present.  Neuro exam negative.

## 2014-05-21 NOTE — ED Provider Notes (Signed)
CSN: [id number]     Arrival date & time 05/21/14  0218 History   First MD Initiated Contact with Patient 05/21/14 0228     Chief Complaint  Patient presents with  . Headache     (Consider location/radiation/quality/duration/timing/severity/associated sxs/prior Treatment) HPI Patient with long history of migraines and chronic pain presents with left-sided frontal headache starting on Saturday. Gradual onset. Headache is throbbing in nature. Is associated with photophobia and nausea. She states she's had several episodes of vomiting. She has no neck pain or stiffness. She's had no fever or chills. She's had no visual changes. She's had no weakness or numbness. States she is taking taking Ultram and Dilaudid at at home with little relief.    Past Medical History  Diagnosis Date  . HSV-1 (herpes simplex virus 1) infection   . Hypothyroid   . Fibromyalgia   . Migraines   . Nausea & vomiting   . Shingles    Past Surgical History  Procedure Laterality Date  . Abdominal hysterectomy  2002    RSO   . Tubal ligation    . Cholecystectomy     Family History  Problem Relation Age of Onset  . Hypertension Mother   . Diabetes Mother    History  Substance Use Topics  . Smoking status: Never Smoker   . Smokeless tobacco: Never Used  . Alcohol Use: 0.6 oz/week    1 Glasses of wine per week     Comment: occassional   OB History   Grav Para Term Preterm Abortions TAB SAB Ect Mult Living   4 2   2 2    2      Review of Systems  Constitutional: Negative for fever and chills.  HENT: Negative for congestion, sinus pressure, sneezing and sore throat.   Eyes: Positive for photophobia. Negative for visual disturbance.  Gastrointestinal: Positive for nausea and vomiting. Negative for abdominal pain and diarrhea.  Musculoskeletal: Negative for back pain, myalgias, neck pain and neck stiffness.  Skin: Negative for rash and wound.  Neurological: Negative for dizziness, weakness,  light-headedness, numbness and headaches.  All other systems reviewed and are negative.     Allergies  Aspirin; Codeine; Hydrocodone-acetaminophen; Other; Penicillins; and Tylenol  Home Medications   Prior to Admission medications   Medication Sig Start Date End Date Taking? Authorizing Provider  albuterol (PROVENTIL HFA;VENTOLIN HFA) 108 (90 BASE) MCG/ACT inhaler Inhale 2 puffs into the lungs every 6 (six) hours as needed. For shortness of breath   Yes Historical Provider, MD  Budesonide (PULMICORT FLEXHALER IN) Inhale 2 puffs into the lungs daily as needed. Shortness of breath and wheezing    Yes Historical Provider, MD  HYDROmorphone (DILAUDID) 4 MG tablet Take 2-4 mg by mouth every 4 (four) hours as needed for moderate pain. For pain   Yes Historical Provider, MD  hydrOXYzine (ATARAX) 50 MG tablet Take 50-100 mg by mouth 2 (two) times daily. 50 mg in the morning and 100 mg at bedtime   Yes Historical Provider, MD  levothyroxine (SYNTHROID, LEVOTHROID) 50 MCG tablet Take 25 mcg by mouth daily.    Yes Historical Provider, MD  Liniments Chinita Pester(SALONPAS) PADS Apply 1 each topically daily as needed (pain).   Yes Historical Provider, MD  traMADol (ULTRAM) 50 MG tablet Take 1 tablet (50 mg total) by mouth every 6 (six) hours as needed for pain. 08/23/13  Yes Harrington ChallengerNancy J Young, NP   BP 148/88  Temp(Src) 98.1 F (36.7 C) (Oral)  Resp 18  SpO2 99% Physical Exam  Nursing note and vitals reviewed. Constitutional: She is oriented to person, place, and time. She appears well-developed and well-nourished. No distress.  HENT:  Head: Normocephalic and atraumatic.  Mouth/Throat: Oropharynx is clear and moist. No oropharyngeal exudate.  Mild left frontal tenderness to percussion.  Eyes: EOM are normal. Pupils are equal, round, and reactive to light.  Photophobia present  Neck: Normal range of motion. Neck supple.  No meningismus  Cardiovascular: Normal rate and regular rhythm.   Pulmonary/Chest: Effort  normal and breath sounds normal. No respiratory distress. She has no wheezes. She has no rales. She exhibits no tenderness.  Abdominal: Soft. Bowel sounds are normal. She exhibits no distension and no mass. There is no tenderness. There is no rebound and no guarding.  Musculoskeletal: Normal range of motion. She exhibits no edema and no tenderness.  Neurological: She is alert and oriented to person, place, and time.  Patient is alert and oriented x3 with clear, goal oriented speech. Patient has 5/5 motor in all extremities. Sensation is intact to light touch. Bilateral finger-to-nose is normal with no signs of dysmetria. Patient has a normal gait and walks without assistance.   Skin: Skin is warm and dry. No rash noted. No erythema.  Psychiatric: She has a normal mood and affect. Her behavior is normal.    ED Course  Procedures (including critical care time) Labs Review Labs Reviewed - No data to display  Imaging Review No results found.   EKG Interpretation None      MDM   Final diagnoses:  None   Disposition receive medication she began complaining of pain in her right lower back. Chest tenderness to palpation over the lumbar paraspinal muscles. I do not believe this is related to medication. We'll give muscle relaxant and reevaluate.  The patient states her headache is improved. She also states that her low back pain is improved after the Robaxin. Vital signs remained normal. Neurologic exam remains normal. Will discharge patient. return precautions given.  Loren Racer, MD 05/21/14 [phone]

## 2014-05-21 NOTE — Discharge Instructions (Signed)

## 2014-09-08 ENCOUNTER — Encounter (HOSPITAL_COMMUNITY): Payer: Self-pay | Admitting: Emergency Medicine

## 2014-10-18 ENCOUNTER — Encounter (HOSPITAL_BASED_OUTPATIENT_CLINIC_OR_DEPARTMENT_OTHER): Payer: Self-pay | Admitting: Emergency Medicine

## 2014-10-18 ENCOUNTER — Emergency Department (HOSPITAL_BASED_OUTPATIENT_CLINIC_OR_DEPARTMENT_OTHER)
Admission: EM | Admit: 2014-10-18 | Discharge: 2014-10-18 | Disposition: A | Discharge status: Home / Self Care | Payer: Medicaid Other | Attending: Emergency Medicine | Admitting: Emergency Medicine

## 2014-10-18 DIAGNOSIS — J069 Acute upper respiratory infection, unspecified: Secondary | ICD-10-CM | POA: Insufficient documentation

## 2014-10-18 DIAGNOSIS — Z880 Allergy status to penicillin: Secondary | ICD-10-CM | POA: Insufficient documentation

## 2014-10-18 DIAGNOSIS — E039 Hypothyroidism, unspecified: Secondary | ICD-10-CM | POA: Insufficient documentation

## 2014-10-18 DIAGNOSIS — J029 Acute pharyngitis, unspecified: Secondary | ICD-10-CM | POA: Diagnosis present

## 2014-10-18 DIAGNOSIS — Z7951 Long term (current) use of inhaled steroids: Secondary | ICD-10-CM | POA: Insufficient documentation

## 2014-10-18 DIAGNOSIS — M797 Fibromyalgia: Secondary | ICD-10-CM | POA: Diagnosis not present

## 2014-10-18 DIAGNOSIS — G43909 Migraine, unspecified, not intractable, without status migrainosus: Secondary | ICD-10-CM | POA: Insufficient documentation

## 2014-10-18 DIAGNOSIS — Z8619 Personal history of other infectious and parasitic diseases: Secondary | ICD-10-CM | POA: Insufficient documentation

## 2014-10-18 DIAGNOSIS — Z79899 Other long term (current) drug therapy: Secondary | ICD-10-CM | POA: Diagnosis not present

## 2014-10-18 LAB — RAPID STREP SCREEN (MED CTR MEBANE ONLY): Streptococcus, Group A Screen (Direct): NEGATIVE

## 2014-10-18 MED ORDER — DOXYCYCLINE HYCLATE 100 MG PO CAPS: 100.0000 mg | ORAL_CAPSULE | Freq: Two times a day (BID) | ORAL | Status: DC

## 2014-10-18 NOTE — ED Provider Notes (Addendum)
CSN: [id number]     Arrival date & time 10/18/14  0026 History   First MD Initiated Contact with Patient 10/18/14 0057     Chief Complaint  Patient presents with  . Sore Throat     (Consider location/radiation/quality/duration/timing/severity/associated sxs/prior Treatment) HPI Comments: Patient is a 38 year old female with history of hypothyroidism, migraines, and fibromyalgia. She presents with complaints of sore throat for the past 5 days with associated ear pressure and facial pressure. She denies any fevers or chills. She denies any chest pain or shortness of breath. She denies any ill contacts.  Patient is a 38 y.o. female presenting with pharyngitis. The history is provided by the patient.  Sore Throat This is a new problem. Episode onset: 5 days ago. The problem occurs constantly. The problem has been gradually worsening. Associated symptoms include headaches. Pertinent negatives include no chest pain and no shortness of breath. The symptoms are aggravated by swallowing. Nothing relieves the symptoms. She has tried nothing for the symptoms. The treatment provided no relief.    Past Medical History  Diagnosis Date  . HSV-1 (herpes simplex virus 1) infection   . Hypothyroid   . Fibromyalgia   . Migraines   . Nausea & vomiting   . Shingles    Past Surgical History  Procedure Laterality Date  . Abdominal hysterectomy  2002    RSO   . Tubal ligation    . Cholecystectomy     Family History  Problem Relation Age of Onset  . Hypertension Mother   . Diabetes Mother    History  Substance Use Topics  . Smoking status: Never Smoker   . Smokeless tobacco: Never Used  . Alcohol Use: 0.6 oz/week    1 Glasses of wine per week     Comment: occassional   OB History    Gravida Para Term Preterm AB TAB SAB Ectopic Multiple Living   4 2   2 2    2      Review of Systems  Respiratory: Negative for shortness of breath.   Cardiovascular: Negative for chest pain.  Neurological:  Positive for headaches.  All other systems reviewed and are negative.     Allergies  Aspirin; Codeine; Hydrocodone-acetaminophen; Other; Penicillins; and Tylenol  Home Medications   Prior to Admission medications   Medication Sig Start Date End Date Taking? Authorizing Provider  albuterol (PROVENTIL HFA;VENTOLIN HFA) 108 (90 BASE) MCG/ACT inhaler Inhale 2 puffs into the lungs every 6 (six) hours as needed. For shortness of breath    Historical Provider, MD  Budesonide (PULMICORT FLEXHALER IN) Inhale 2 puffs into the lungs daily as needed. Shortness of breath and wheezing     Historical Provider, MD  HYDROmorphone (DILAUDID) 4 MG tablet Take 2-4 mg by mouth every 4 (four) hours as needed for moderate pain. For pain    Historical Provider, MD  hydrOXYzine (ATARAX) 50 MG tablet Take 50-100 mg by mouth 2 (two) times daily. 50 mg in the morning and 100 mg at bedtime    Historical Provider, MD  levothyroxine (SYNTHROID, LEVOTHROID) 50 MCG tablet Take 25 mcg by mouth daily.     Historical Provider, MD  Liniments Chinita Pester(SALONPAS) PADS Apply 1 each topically daily as needed (pain).    Historical Provider, MD  methocarbamol (ROBAXIN) 500 MG tablet Take 1 tablet (500 mg total) by mouth every 8 (eight) hours as needed for muscle spasms. 05/21/14   Loren Raceravid Yelverton, MD  metoCLOPramide (REGLAN) 10 MG tablet Take 1 tablet (10 mg  total) by mouth every 6 (six) hours as needed for nausea (nausea/headache). 05/21/14   Loren Raceravid Yelverton, MD  traMADol (ULTRAM) 50 MG tablet Take 1 tablet (50 mg total) by mouth every 6 (six) hours as needed for pain. 08/23/13   Harrington ChallengerNancy J Young, NP   BP 141/83 mmHg  Pulse 73  Temp(Src) 98.6 F (37 C) (Oral)  Resp 16  Ht 5' (1.524 m)  Wt 170 lb (77.111 kg)  BMI 33.20 kg/m2  SpO2 100% Physical Exam  Constitutional: She is oriented to person, place, and time. She appears well-developed and well-nourished. No distress.  HENT:  Head: Normocephalic and atraumatic.  Right Ear: External ear  normal.  Left Ear: External ear normal.  Mouth/Throat: Oropharynx is clear and moist. No oropharyngeal exudate.  TMs are clear bilaterally.  There is some maxillofacial tenderness to palpation.  Neck: Normal range of motion. Neck supple.  Cardiovascular: Normal rate and regular rhythm.  Exam reveals no gallop and no friction rub.   No murmur heard. Pulmonary/Chest: Effort normal and breath sounds normal. No respiratory distress. She has no wheezes.  Abdominal: Soft. Bowel sounds are normal. She exhibits no distension. There is no tenderness.  Musculoskeletal: Normal range of motion.  Neurological: She is alert and oriented to person, place, and time.  Skin: Skin is warm and dry. She is not diaphoretic.  Nursing note and vitals reviewed.   ED Course  Procedures (including critical care time) Labs Review Labs Reviewed  RAPID STREP SCREEN  CULTURE, GROUP A STREP    Imaging Review No results found.   EKG Interpretation None      MDM   Final diagnoses:  None    Symptoms sound viral in nature and strep test is negative. She is reporting some facial discomfort and postnasal drip. I have advised her to take over-the-counter medications as needed, however she is explained to me that she is unable to take any of these medicines due to "anaphylaxis to the filler that the medicines are made from". I will provide her with a prescription for doxycycline. If she is not feeling better in the next 2 days she can get this prescription filled.    Geoffery Lyonsouglas Yoan Sallade, MD 10/18/14 [id number]  Geoffery Lyonsouglas Leigha Olberding, MD 10/18/14 0110

## 2014-10-18 NOTE — ED Notes (Signed)
Sore throat and nose "tingling" x5 days.

## 2014-10-18 NOTE — ED Notes (Signed)
C/o sorerthroat x 1 week w bilateral ear pain off and on and nose tingling

## 2014-10-18 NOTE — Discharge Instructions (Signed)
If you are not improving in the next 2-3 days, fill the prescription for doxycycline you have been provided.  Return to the emergency department if your symptoms substantially worsen or change.   Upper Respiratory Infection, Adult An upper respiratory infection (URI) is also sometimes known as the common cold. The upper respiratory tract includes the nose, sinuses, throat, trachea, and bronchi. Bronchi are the airways leading to the lungs. Most people improve within 1 week, but symptoms can last up to 2 weeks. A residual cough may last even longer.  CAUSES Many different viruses can infect the tissues lining the upper respiratory tract. The tissues become irritated and inflamed and often become very moist. Mucus production is also common. A cold is contagious. You can easily spread the virus to others by oral contact. This includes kissing, sharing a glass, coughing, or sneezing. Touching your mouth or nose and then touching a surface, which is then touched by another person, can also spread the virus. SYMPTOMS  Symptoms typically develop 1 to 3 days after you come in contact with a cold virus. Symptoms vary from person to person. They may include:  Runny nose.  Sneezing.  Nasal congestion.  Sinus irritation.  Sore throat.  Loss of voice (laryngitis).  Cough.  Fatigue.  Muscle aches.  Loss of appetite.  Headache.  Low-grade fever. DIAGNOSIS  You might diagnose your own cold based on familiar symptoms, since most people get a cold 2 to 3 times a year. Your caregiver can confirm this based on your exam. Most importantly, your caregiver can check that your symptoms are not due to another disease such as strep throat, sinusitis, pneumonia, asthma, or epiglottitis. Blood tests, throat tests, and X-rays are not necessary to diagnose a common cold, but they may sometimes be helpful in excluding other more serious diseases. Your caregiver will decide if any further tests are  required. RISKS AND COMPLICATIONS  You may be at risk for a more severe case of the common cold if you smoke cigarettes, have chronic heart disease (such as heart failure) or lung disease (such as asthma), or if you have a weakened immune system. The very young and very old are also at risk for more serious infections. Bacterial sinusitis, middle ear infections, and bacterial pneumonia can complicate the common cold. The common cold can worsen asthma and chronic obstructive pulmonary disease (COPD). Sometimes, these complications can require emergency medical care and may be life-threatening. PREVENTION  The best way to protect against getting a cold is to practice good hygiene. Avoid oral or hand contact with people with cold symptoms. Wash your hands often if contact occurs. There is no clear evidence that vitamin C, vitamin E, echinacea, or exercise reduces the chance of developing a cold. However, it is always recommended to get plenty of rest and practice good nutrition. TREATMENT  Treatment is directed at relieving symptoms. There is no cure. Antibiotics are not effective, because the infection is caused by a virus, not by bacteria. Treatment may include:  Increased fluid intake. Sports drinks offer valuable electrolytes, sugars, and fluids.  Breathing heated mist or steam (vaporizer or shower).  Eating chicken soup or other clear broths, and maintaining good nutrition.  Getting plenty of rest.  Using gargles or lozenges for comfort.  Controlling fevers with ibuprofen or acetaminophen as directed by your caregiver.  Increasing usage of your inhaler if you have asthma. Zinc gel and zinc lozenges, taken in the first 24 hours of the common cold, can shorten  the duration and lessen the severity of symptoms. Pain medicines may help with fever, muscle aches, and throat pain. A variety of non-prescription medicines are available to treat congestion and runny nose. Your caregiver can make  recommendations and may suggest nasal or lung inhalers for other symptoms.  HOME CARE INSTRUCTIONS   Only take over-the-counter or prescription medicines for pain, discomfort, or fever as directed by your caregiver.  Use a warm mist humidifier or inhale steam from a shower to increase air moisture. This may keep secretions moist and make it easier to breathe.  Drink enough water and fluids to keep your urine clear or pale yellow.  Rest as needed.  Return to work when your temperature has returned to normal or as your caregiver advises. You may need to stay home longer to avoid infecting others. You can also use a face mask and careful hand washing to prevent spread of the virus. SEEK MEDICAL CARE IF:   After the first few days, you feel you are getting worse rather than better.  You need your caregiver's advice about medicines to control symptoms.  You develop chills, worsening shortness of breath, or brown or red sputum. These may be signs of pneumonia.  You develop yellow or brown nasal discharge or pain in the face, especially when you bend forward. These may be signs of sinusitis.  You develop a fever, swollen neck glands, pain with swallowing, or white areas in the back of your throat. These may be signs of strep throat. SEEK IMMEDIATE MEDICAL CARE IF:   You have a fever.  You develop severe or persistent headache, ear pain, sinus pain, or chest pain.  You develop wheezing, a prolonged cough, cough up blood, or have a change in your usual mucus (if you have chronic lung disease).  You develop sore muscles or a stiff neck. Document Released: 04/19/2001 Document Revised: 01/16/2012 Document Reviewed: 01/29/2014 [facility] Patient Information 2015 [place], [place]. This information is not intended to replace advice given to you by your health care provider. Make sure you discuss any questions you have with your health care provider.

## 2014-10-19 LAB — CULTURE, GROUP A STREP

## 2015-01-15 ENCOUNTER — Emergency Department (HOSPITAL_BASED_OUTPATIENT_CLINIC_OR_DEPARTMENT_OTHER)
Admission: EM | Admit: 2015-01-15 | Discharge: 2015-01-15 | Disposition: A | Discharge status: Home / Self Care | Payer: Medicaid Other | Attending: Emergency Medicine | Admitting: Emergency Medicine

## 2015-01-15 ENCOUNTER — Encounter (HOSPITAL_BASED_OUTPATIENT_CLINIC_OR_DEPARTMENT_OTHER): Payer: Self-pay | Admitting: *Deleted

## 2015-01-15 ENCOUNTER — Emergency Department (HOSPITAL_BASED_OUTPATIENT_CLINIC_OR_DEPARTMENT_OTHER): Payer: Medicaid Other

## 2015-01-15 DIAGNOSIS — Z880 Allergy status to penicillin: Secondary | ICD-10-CM | POA: Diagnosis not present

## 2015-01-15 DIAGNOSIS — N8320 Unspecified ovarian cysts: Secondary | ICD-10-CM | POA: Insufficient documentation

## 2015-01-15 DIAGNOSIS — Z8619 Personal history of other infectious and parasitic diseases: Secondary | ICD-10-CM | POA: Diagnosis not present

## 2015-01-15 DIAGNOSIS — E039 Hypothyroidism, unspecified: Secondary | ICD-10-CM | POA: Insufficient documentation

## 2015-01-15 DIAGNOSIS — R109 Unspecified abdominal pain: Secondary | ICD-10-CM

## 2015-01-15 DIAGNOSIS — Z79899 Other long term (current) drug therapy: Secondary | ICD-10-CM | POA: Insufficient documentation

## 2015-01-15 DIAGNOSIS — R1032 Left lower quadrant pain: Secondary | ICD-10-CM | POA: Insufficient documentation

## 2015-01-15 DIAGNOSIS — Z9049 Acquired absence of other specified parts of digestive tract: Secondary | ICD-10-CM | POA: Insufficient documentation

## 2015-01-15 DIAGNOSIS — N83202 Unspecified ovarian cyst, left side: Secondary | ICD-10-CM

## 2015-01-15 DIAGNOSIS — Z9851 Tubal ligation status: Secondary | ICD-10-CM | POA: Insufficient documentation

## 2015-01-15 DIAGNOSIS — G43909 Migraine, unspecified, not intractable, without status migrainosus: Secondary | ICD-10-CM | POA: Diagnosis not present

## 2015-01-15 DIAGNOSIS — M797 Fibromyalgia: Secondary | ICD-10-CM | POA: Diagnosis not present

## 2015-01-15 DIAGNOSIS — Z792 Long term (current) use of antibiotics: Secondary | ICD-10-CM | POA: Insufficient documentation

## 2015-01-15 DIAGNOSIS — R1030 Lower abdominal pain, unspecified: Secondary | ICD-10-CM

## 2015-01-15 DIAGNOSIS — Z90710 Acquired absence of both cervix and uterus: Secondary | ICD-10-CM | POA: Insufficient documentation

## 2015-01-15 DIAGNOSIS — M549 Dorsalgia, unspecified: Secondary | ICD-10-CM | POA: Diagnosis present

## 2015-01-15 LAB — COMPREHENSIVE METABOLIC PANEL
ALBUMIN: 3.9 g/dL (ref 3.5–5.2)
ALT: 30 U/L (ref 0–35)
AST: 23 U/L (ref 0–37)
Alkaline Phosphatase: 52 U/L (ref 39–117)
Anion gap: 7 (ref 5–15)
BUN: 8 mg/dL (ref 6–23)
CO2: 26 mmol/L (ref 19–32)
Calcium: 8.8 mg/dL (ref 8.4–10.5)
Chloride: 104 mmol/L (ref 96–112)
Creatinine, Ser: 0.61 mg/dL (ref 0.50–1.10)
GFR calc Af Amer: >90 mL/min (ref >90)
GFR calc non Af Amer: >90 mL/min (ref >90)
GLUCOSE: 120 mg/dL — AB (ref 70–99)
Potassium: 3.8 mmol/L (ref 3.5–5.1)
Sodium: 137 mmol/L (ref 135–145)
Total Bilirubin: 0.3 mg/dL (ref 0.3–1.2)
Total Protein: 6.8 g/dL (ref 6.0–8.3)

## 2015-01-15 LAB — URINALYSIS, ROUTINE W REFLEX MICROSCOPIC
GLUCOSE, UA: NEGATIVE mg/dL
Hgb urine dipstick: NEGATIVE
KETONES UR: NEGATIVE mg/dL
LEUKOCYTES UA: NEGATIVE
NITRITE: NEGATIVE
PH: 5 (ref 5.0–8.0)
Protein, ur: NEGATIVE mg/dL
Specific Gravity, Urine: 1.031 — ABNORMAL HIGH (ref 1.005–1.030)
UROBILINOGEN UA: 0.2 mg/dL (ref 0.0–1.0)

## 2015-01-15 LAB — CBC WITH DIFFERENTIAL/PLATELET
Basophils Absolute: 0 10*3/uL (ref 0.0–0.1)
Basophils Relative: 0 % (ref 0–1)
EOS PCT: 2 % (ref 0–5)
Eosinophils Absolute: 0.2 10*3/uL (ref 0.0–0.7)
HCT: 35 % — ABNORMAL LOW (ref 36.0–46.0)
Hemoglobin: 12 g/dL (ref 12.0–15.0)
LYMPHS PCT: 46 % (ref 12–46)
Lymphs Abs: 4.2 10*3/uL — ABNORMAL HIGH (ref 0.7–4.0)
MCH: 28.8 pg (ref 26.0–34.0)
MCHC: 34.3 g/dL (ref 30.0–36.0)
MCV: 84.1 fL (ref 78.0–100.0)
MONOS PCT: 8 % (ref 3–12)
Monocytes Absolute: 0.7 10*3/uL (ref 0.1–1.0)
NEUTROS PCT: 44 % (ref 43–77)
Neutro Abs: 4 10*3/uL (ref 1.7–7.7)
Platelets: 300 10*3/uL (ref 150–400)
RBC: 4.16 MIL/uL (ref 3.87–5.11)
RDW: 12.1 % (ref 11.5–15.5)
WBC: 9.2 10*3/uL (ref 4.0–10.5)

## 2015-01-15 LAB — LIPASE, BLOOD: Lipase: 23 U/L (ref 11–59)

## 2015-01-15 MED ORDER — ONDANSETRON HCL 4 MG/2ML IJ SOLN
4.0000 mg | Freq: Once | INTRAMUSCULAR | Status: AC
  Administered 2015-01-15: 4 mg via INTRAVENOUS
  Filled 2015-01-15: qty 2

## 2015-01-15 MED ORDER — HYDROMORPHONE HCL 4 MG PO TABS: 4.0000 mg | ORAL_TABLET | Freq: Four times a day (QID) | ORAL | Status: AC | PRN

## 2015-01-15 MED ORDER — IOHEXOL 300 MG/ML  SOLN
25.0000 mL | Freq: Once | INTRAMUSCULAR | Status: AC | PRN
  Administered 2015-01-15: 25 mL via ORAL

## 2015-01-15 MED ORDER — HYDROMORPHONE HCL 1 MG/ML IJ SOLN
1.0000 mg | Freq: Once | INTRAMUSCULAR | Status: AC
  Administered 2015-01-15: 1 mg via INTRAVENOUS
  Filled 2015-01-15: qty 1

## 2015-01-15 MED ORDER — IOHEXOL 300 MG/ML  SOLN
100.0000 mL | Freq: Once | INTRAMUSCULAR | Status: AC | PRN
  Administered 2015-01-15: 100 mL via INTRAVENOUS

## 2015-01-15 MED ORDER — SODIUM CHLORIDE 0.9 % IV BOLUS (SEPSIS)
500.0000 mL | Freq: Once | INTRAVENOUS | Status: AC
  Administered 2015-01-15: 500 mL via INTRAVENOUS

## 2015-01-15 MED ORDER — SODIUM CHLORIDE 0.9 % IV SOLN: INTRAVENOUS | Status: DC

## 2015-01-15 NOTE — Discharge Instructions (Signed)
Workup showed 2 left ovarian cyst. Take medicine as directed. Follow-up with your doctor if not improved in a few days. Return for any new or worse symptoms.

## 2015-01-15 NOTE — ED Notes (Signed)
Back pain and lower left abdominal pain x 3 days.

## 2015-01-15 NOTE — ED Provider Notes (Signed)
CSN: [id number]     Arrival date & time 01/15/15  1421 History   First MD Initiated Contact with Patient 01/15/15 1506     Chief Complaint  Patient presents with  . Back Pain     (Consider location/radiation/quality/duration/timing/severity/associated sxs/prior Treatment) Patient is a 39 y.o. female presenting with back pain. The history is provided by the patient.  Back Pain Associated symptoms: abdominal pain   Associated symptoms: no chest pain, no dysuria, no fever and no headaches   patient with complaint of left lower quadrant abdominal pain for 3 days. Pain is constant ache and sharp at times. Currently 5 out of 10. Not associated with vomiting diarrhea or dysuria no fever. Patient has a hysterectomy but still has her left ovary.  Past Medical History  Diagnosis Date  . HSV-1 (herpes simplex virus 1) infection   . Hypothyroid   . Fibromyalgia   . Migraines   . Nausea & vomiting   . Shingles    Past Surgical History  Procedure Laterality Date  . Abdominal hysterectomy  2002    RSO   . Tubal ligation    . Cholecystectomy     Family History  Problem Relation Age of Onset  . Hypertension Mother   . Diabetes Mother    History  Substance Use Topics  . Smoking status: Never Smoker   . Smokeless tobacco: Never Used  . Alcohol Use: 0.6 oz/week    1 Glasses of wine per week     Comment: occassional   OB History    Gravida Para Term Preterm AB TAB SAB Ectopic Multiple Living   4 2   2 2    2      Review of Systems  Constitutional: Negative for fever.  HENT: Negative for congestion.   Eyes: Negative for redness.  Respiratory: Negative for shortness of breath.   Cardiovascular: Negative for chest pain.  Gastrointestinal: Positive for abdominal pain. Negative for nausea and vomiting.  Genitourinary: Negative for dysuria.  Musculoskeletal: Positive for back pain.  Skin: Negative for rash.  Neurological: Negative for headaches.  Hematological: Does not bruise/bleed  easily.  Psychiatric/Behavioral: Negative for confusion.      Allergies  Aspirin; Codeine; Hydrocodone-acetaminophen; Other; Penicillins; and Tylenol  Home Medications   Prior to Admission medications   Medication Sig Start Date End Date Taking? Authorizing Provider  albuterol (PROVENTIL HFA;VENTOLIN HFA) 108 (90 BASE) MCG/ACT inhaler Inhale 2 puffs into the lungs every 6 (six) hours as needed. For shortness of breath    Historical Provider, MD  Budesonide (PULMICORT FLEXHALER IN) Inhale 2 puffs into the lungs daily as needed. Shortness of breath and wheezing     Historical Provider, MD  doxycycline (VIBRAMYCIN) 100 MG capsule Take 1 capsule (100 mg total) by mouth 2 (two) times daily. 10/18/14   Geoffery Lyons, MD  HYDROmorphone (DILAUDID) 4 MG tablet Take 2-4 mg by mouth every 4 (four) hours as needed for moderate pain. For pain    Historical Provider, MD  HYDROmorphone (DILAUDID) 4 MG tablet Take 1 tablet (4 mg total) by mouth every 6 (six) hours as needed for severe pain. 01/15/15   Vanetta Mulders, MD  hydrOXYzine (ATARAX) 50 MG tablet Take 50-100 mg by mouth 2 (two) times daily. 50 mg in the morning and 100 mg at bedtime    Historical Provider, MD  levothyroxine (SYNTHROID, LEVOTHROID) 50 MCG tablet Take 25 mcg by mouth daily.     Historical Provider, MD  Liniments Chinita Pester) PADS Apply 1  each topically daily as needed (pain).    Historical Provider, MD  methocarbamol (ROBAXIN) 500 MG tablet Take 1 tablet (500 mg total) by mouth every 8 (eight) hours as needed for muscle spasms. 05/21/14   Loren Raceravid Yelverton, MD  metoCLOPramide (REGLAN) 10 MG tablet Take 1 tablet (10 mg total) by mouth every 6 (six) hours as needed for nausea (nausea/headache). 05/21/14   Loren Raceravid Yelverton, MD  traMADol (ULTRAM) 50 MG tablet Take 1 tablet (50 mg total) by mouth every 6 (six) hours as needed for pain. 08/23/13   Harrington ChallengerNancy J Young, NP   BP 116/71 mmHg  Pulse 81  Temp(Src) 98.1 F (36.7 C) (Oral)  Resp 18  Ht 5'  (1.524 m)  Wt 167 lb (75.751 kg)  BMI 32.62 kg/m2  SpO2 98% Physical Exam  Constitutional: She is oriented to person, place, and time. She appears well-developed and well-nourished. No distress.  HENT:  Head: Normocephalic and atraumatic.  Mouth/Throat: Oropharynx is clear and moist.  Eyes: Conjunctivae and EOM are normal. Pupils are equal, round, and reactive to light.  Neck: Normal range of motion.  Cardiovascular: Normal rate, regular rhythm and normal heart sounds.   Pulmonary/Chest: Effort normal and breath sounds normal. No respiratory distress.  Abdominal: Soft. Bowel sounds are normal. There is tenderness.   Mild tenderness without guarding left lower quadrant.  Musculoskeletal: Normal range of motion.  Neurological: She is alert and oriented to person, place, and time. No cranial nerve deficit. She exhibits normal muscle tone. Coordination normal.  Skin: Skin is warm. No rash noted.  Nursing note and vitals reviewed.   ED Course  Procedures (including critical care time) Labs Review Labs Reviewed  URINALYSIS, ROUTINE W REFLEX MICROSCOPIC - Abnormal; Notable for the following:    APPearance CLOUDY (*)    Specific Gravity, Urine 1.031 (*)    Bilirubin Urine SMALL (*)    All other components within normal limits  CBC WITH DIFFERENTIAL/PLATELET - Abnormal; Notable for the following:    HCT 35.0 (*)    Lymphs Abs 4.2 (*)    All other components within normal limits  COMPREHENSIVE METABOLIC PANEL - Abnormal; Notable for the following:    Glucose, Bld 120 (*)    All other components within normal limits  LIPASE, BLOOD   Results for orders placed or performed during the hospital encounter of 01/15/15  Urinalysis, Routine w reflex microscopic  Result Value Ref Range   Color, Urine YELLOW YELLOW   APPearance CLOUDY (A) CLEAR   Specific Gravity, Urine 1.031 (H) 1.005 - 1.030   pH 5.0 5.0 - 8.0   Glucose, UA NEGATIVE NEGATIVE mg/dL   Hgb urine dipstick NEGATIVE NEGATIVE    Bilirubin Urine SMALL (A) NEGATIVE   Ketones, ur NEGATIVE NEGATIVE mg/dL   Protein, ur NEGATIVE NEGATIVE mg/dL   Urobilinogen, UA 0.2 0.0 - 1.0 mg/dL   Nitrite NEGATIVE NEGATIVE   Leukocytes, UA NEGATIVE NEGATIVE  Lipase, blood  Result Value Ref Range   Lipase 23 11 - 59 U/L  CBC with Differential/Platelet  Result Value Ref Range   WBC 9.2 4.0 - 10.5 K/uL   RBC 4.16 3.87 - 5.11 MIL/uL   Hemoglobin 12.0 12.0 - 15.0 g/dL   HCT 16.135.0 (L) 09.636.0 - 04.546.0 %   MCV 84.1 78.0 - 100.0 fL   MCH 28.8 26.0 - 34.0 pg   MCHC 34.3 30.0 - 36.0 g/dL   RDW 40.912.1 81.111.5 - 91.415.5 %   Platelets 300 150 - 400 K/uL  Neutrophils Relative % 44 43 - 77 %   Neutro Abs 4.0 1.7 - 7.7 K/uL   Lymphocytes Relative 46 12 - 46 %   Lymphs Abs 4.2 (H) 0.7 - 4.0 K/uL   Monocytes Relative 8 3 - 12 %   Monocytes Absolute 0.7 0.1 - 1.0 K/uL   Eosinophils Relative 2 0 - 5 %   Eosinophils Absolute 0.2 0.0 - 0.7 K/uL   Basophils Relative 0 0 - 1 %   Basophils Absolute 0.0 0.0 - 0.1 K/uL  Comprehensive metabolic panel  Result Value Ref Range   Sodium 137 135 - 145 mmol/L   Potassium 3.8 3.5 - 5.1 mmol/L   Chloride 104 96 - 112 mmol/L   CO2 26 19 - 32 mmol/L   Glucose, Bld 120 (H) 70 - 99 mg/dL   BUN 8 6 - 23 mg/dL   Creatinine, Ser 1.61 0.50 - 1.10 mg/dL   Calcium 8.8 8.4 - 09.6 mg/dL   Total Protein 6.8 6.0 - 8.3 g/dL   Albumin 3.9 3.5 - 5.2 g/dL   AST 23 0 - 37 U/L   ALT 30 0 - 35 U/L   Alkaline Phosphatase 52 39 - 117 U/L   Total Bilirubin 0.3 0.3 - 1.2 mg/dL   GFR calc non Af Amer >90 >90 mL/min   GFR calc Af Amer >90 >90 mL/min   Anion gap 7 5 - 15     Imaging Review Ct Abdomen Pelvis W Contrast  01/15/2015   CLINICAL DATA:  Lower abdominal pain, nausea, status post hysterectomy, cholecystectomy, bilateral tubal ligation  EXAM: CT ABDOMEN AND PELVIS WITH CONTRAST  TECHNIQUE: Multidetector CT imaging of the abdomen and pelvis was performed using the standard protocol following bolus administration of  intravenous contrast.  CONTRAST:  25mL OMNIPAQUE IOHEXOL 300 MG/ML SOLN, OMNIPAQUE IOHEXOL 300 MG/ML SOLN  COMPARISON:  CT scan 07/11/2013  FINDINGS: Sagittal images of the spine are unremarkable.  Significant fatty infiltration of the liver again noted. Lung bases are unremarkable. There is mild thickening of pyloric wall. Clinical correlation is necessary to exclude mild inflammatory changes.  CBD measures 6.8 mm in diameter.  The pancreas, spleen and adrenal glands are unremarkable. Abdominal aorta is unremarkable.  Kidneys are symmetrical in size and enhancement. No hydronephrosis or hydroureter.  Moderate stool noted in right colon and proximal transverse colon. No pericecal inflammation. Normal retrocecal appendix noted in axial image 68. The terminal ileum is unremarkable.  The patient is status postcholecystectomy.  No small bowel obstruction. No ascites or free air. No adenopathy. Delayed renal images shows bilateral renal symmetrical excretion.  Few diverticula are noted distal sigmoid colon. No evidence of acute diverticulitis. The uterus is surgically absent. There is a cyst/ follicle within left ovary measures 1.9 cm. A second cyst/follicle within left ovary measures 1.6 cm. No distal colonic obstruction. Urinary bladder is unremarkable.  IMPRESSION: 1. Again noted significant fatty infiltration of the liver. 2. Mild thickening of pyloric wall. Mild inflammatory changes at this level cannot be excluded. Clinical correlation is necessary. 3. Status postcholecystectomy. 4. No hydronephrosis or hydroureter. 5. Normal appendix.  No pericecal inflammation. 6. Surgically absent uterus. There is a cyst/follicle within left ovary measures 1.9 cm. Second cyst/follicle within left ovary measures 1.6 cm. Unremarkable urinary bladder.   Electronically Signed   By: Natasha Mead M.D.   On: 01/15/2015 16:56     EKG Interpretation None      MDM   Final diagnoses:  Abdominal pain  Cyst of left ovary     Workup for the left lower quadrant abdominal pain reveals ovarian cyst on that side. No cuff located in factors. Labs otherwise are normal. Patient will be discharged home with pain medication and follow-up with her doctor.    Vanetta Mulders, MD 01/15/15 1730

## 2016-04-02 ENCOUNTER — Encounter (HOSPITAL_COMMUNITY): Payer: Self-pay | Admitting: Emergency Medicine

## 2016-04-02 ENCOUNTER — Emergency Department (HOSPITAL_COMMUNITY)
Admission: EM | Admit: 2016-04-02 | Discharge: 2016-04-02 | Disposition: A | Discharge status: Home / Self Care | Payer: Medicaid Other | Attending: Emergency Medicine | Admitting: Emergency Medicine

## 2016-04-02 DIAGNOSIS — G8929 Other chronic pain: Secondary | ICD-10-CM | POA: Insufficient documentation

## 2016-04-02 DIAGNOSIS — M797 Fibromyalgia: Secondary | ICD-10-CM | POA: Diagnosis not present

## 2016-04-02 DIAGNOSIS — Z79899 Other long term (current) drug therapy: Secondary | ICD-10-CM | POA: Diagnosis not present

## 2016-04-02 DIAGNOSIS — E039 Hypothyroidism, unspecified: Secondary | ICD-10-CM | POA: Diagnosis not present

## 2016-04-02 DIAGNOSIS — M25512 Pain in left shoulder: Secondary | ICD-10-CM | POA: Diagnosis present

## 2016-04-02 MED ORDER — METHOCARBAMOL 500 MG PO TABS
500.0000 mg | ORAL_TABLET | Freq: Once | ORAL | Status: AC
  Administered 2016-04-02: 500 mg via ORAL
  Filled 2016-04-02: qty 1

## 2016-04-02 MED ORDER — METHOCARBAMOL 500 MG PO TABS: 1000.0000 mg | ORAL_TABLET | Freq: Four times a day (QID) | ORAL | Status: AC | PRN

## 2016-04-02 NOTE — ED Notes (Addendum)
Pt c/o shoulder, neck, and foot pain and spasms. Pt says she has fibromyalgia but that this is worse than normal. Has pinched nerve that causes shoulder pain, but also more than normal. Reports "anaphylactic reaction to all OTC medications". "Anything I take has to be a prescription." Denies any recent trauma or excess labor that may be root of pain/injury.

## 2016-04-02 NOTE — Discharge Instructions (Signed)
For breakthrough pain you may take Robaxin. Do not drink alcohol, drive or operate heavy machinery when taking Robaxin.  Please follow with your primary care doctor in the next 2 days for a check-up. They must obtain records for further management.   Do not hesitate to return to the Emergency Department for any new, worsening or concerning symptoms.     Chronic Pain Chronic pain can be defined as pain that is off and on and lasts for 3-6 months or longer. Many things cause chronic pain, which can make it difficult to make a diagnosis. There are many treatment options available for chronic pain. However, finding a treatment that works well for you may require trying various approaches until the right one is found. Many people benefit from a combination of two or more types of treatment to control their pain. SYMPTOMS  Chronic pain can occur anywhere in the body and can range from mild to very severe. Some types of chronic pain include:  Headache.  Low back pain.  Cancer pain.  Arthritis pain.  Neurogenic pain. This is pain resulting from damage to nerves. People with chronic pain may also have other symptoms such as:  Depression.  Anger.  Insomnia.  Anxiety. DIAGNOSIS  Your health care provider will help diagnose your condition over time. In many cases, the initial focus will be on excluding possible conditions that could be causing the pain. Depending on your symptoms, your health care provider may order tests to diagnose your condition. Some of these tests may include:   Blood tests.   CT scan.   MRI.   X-rays.   Ultrasounds.   Nerve conduction studies.  You may need to see a specialist.  TREATMENT  Finding treatment that works well may take time. You may be referred to a pain specialist. He or she may prescribe medicine or therapies, such as:   Mindful meditation or yoga.  Shots (injections) of numbing or pain-relieving medicines into the spine or area of  pain.  Local electrical stimulation.  Acupuncture.   Massage therapy.   Aroma, color, light, or sound therapy.   Biofeedback.   Working with a physical therapist to keep from getting stiff.   Regular, gentle exercise.   Cognitive or behavioral therapy.   Group support.  Sometimes, surgery may be recommended.  HOME CARE INSTRUCTIONS   Take all medicines as directed by your health care provider.   Lessen stress in your life by relaxing and doing things such as listening to calming music.   Exercise or be active as directed by your health care provider.   Eat a healthy diet and include things such as vegetables, fruits, fish, and lean meats in your diet.   Keep all follow-up appointments with your health care provider.   Attend a support group with others suffering from chronic pain. SEEK MEDICAL CARE IF:   Your pain gets worse.   You develop a new pain that was not there before.   You cannot tolerate medicines given to you by your health care provider.   You have new symptoms since your last visit with your health care provider.  SEEK IMMEDIATE MEDICAL CARE IF:   You feel weak.   You have decreased sensation or numbness.   You lose control of bowel or bladder function.   Your pain suddenly gets much worse.   You develop shaking.  You develop chills.  You develop confusion.  You develop chest pain.  You develop shortness of breath.  MAKE SURE YOU:  Understand these instructions.  Will watch your condition.  Will get help right away if you are not doing well or get worse.   This information is not intended to replace advice given to you by your health care provider. Make sure you discuss any questions you have with your health care provider.   Document Released: 07/16/2002 Document Revised: 06/26/2013 Document Reviewed: 04/19/2013 Elsevier Interactive Patient Education [employer]

## 2016-04-02 NOTE — ED Provider Notes (Signed)
CSN: [id number]     Arrival date & time 04/02/16  0609 History   First MD Initiated Contact with Patient 04/02/16 [phone]     Chief Complaint  Patient presents with  . Shoulder Pain  . Neck Pain  . Foot Pain     (Consider location/radiation/quality/duration/timing/severity/associated sxs/prior Treatment) HPI  Blood pressure 132/89, pulse 74, temperature 97.6 F (36.4 C), temperature source Oral, resp. rate 22, SpO2 99 %.  Sara Garrison is a 40 y.o. female past medical history significant for hypothyroid, fibromyalgia complaining of exacerbation of her chronic pain which she's had for over 3 years. States pain is in the bottom of her feet, she also has pain in the left shoulder described as nerve pain. States that she hasn't taken and make any pain medication prior to arrival because nothing helps. States that she used to have a prescription for Dilaudid, she has a prescription for tramadol but doesn't take it because it is not helpful. She cannot take any over-the-counter medication because she has an anaphylactic reaction to all over-the-counter medications, states she's never been allergy tested because she reacts so severely.  Past Medical History  Diagnosis Date  . HSV-1 (herpes simplex virus 1) infection   . Hypothyroid   . Fibromyalgia   . Migraines   . Nausea & vomiting   . Shingles    Past Surgical History  Procedure Laterality Date  . Abdominal hysterectomy  2002    RSO   . Tubal ligation    . Cholecystectomy     Family History  Problem Relation Age of Onset  . Hypertension Mother   . Diabetes Mother    Social History  Substance Use Topics  . Smoking status: Never Smoker   . Smokeless tobacco: Never Used  . Alcohol Use: 0.6 oz/week    1 Glasses of wine per week     Comment: occassional   OB History    Gravida Para Term Preterm AB TAB SAB Ectopic Multiple Living   Review of Systems  10 systems reviewed and found to be negative,  except as noted in the HPI.   Allergies  Aspirin; Codeine; Hydrocodone-acetaminophen; Other; Penicillins; and Tylenol  Home Medications   Prior to Admission medications   Medication Sig Start Date End Date Taking? Authorizing Provider  albuterol (PROVENTIL HFA;VENTOLIN HFA) 108 (90 BASE) MCG/ACT inhaler Inhale 2 puffs into the lungs every 6 (six) hours as needed. For shortness of breath   Yes Historical Provider, MD  Budesonide (PULMICORT FLEXHALER IN) Inhale 2 puffs into the lungs daily as needed. Shortness of breath and wheezing    Yes Historical Provider, MD  HYDROmorphone (DILAUDID) 4 MG tablet Take 1 tablet (4 mg total) by mouth every 6 (six) hours as needed for severe pain. Patient taking differently: Take 2-4 mg by mouth every 6 (six) hours as needed for severe pain.  01/15/15  Yes Vanetta Mulders, MD  hydrOXYzine (ATARAX) 50 MG tablet Take 50-100 mg by mouth 2 (two) times daily. 50 mg in the morning and 100 mg at bedtime   Yes Historical Provider, MD  levothyroxine (SYNTHROID, LEVOTHROID) 50 MCG tablet Take 25 mcg by mouth daily.    Yes Historical Provider, MD  Liniments Chinita Pester) PADS Apply 1 each topically daily as needed (pain).   Yes Historical Provider, MD  traMADol (ULTRAM) 50 MG tablet Take 1 tablet (50 mg total) by mouth every 6 (six) hours  as needed for pain. 08/23/13  Yes Harrington ChallengerNancy J Young, NP   BP 132/89 mmHg  Pulse 74  Temp(Src) 97.6 F (36.4 C) (Oral)  Resp 22  SpO2 99% Physical Exam  Constitutional: She is oriented to person, place, and time. She appears well-developed and well-nourished. No distress.  HENT:  Head: Normocephalic and atraumatic.  Mouth/Throat: Oropharynx is clear and moist.  Eyes: Conjunctivae and EOM are normal. Pupils are equal, round, and reactive to light.  Neck: Normal range of motion.  Spurling test is negative bilaterally.  No midline C-spine  tenderness to palpation or step-offs appreciated. Patient has full range of motion without  pain.  Grip strength, biceps, triceps 5/5 bilaterally;  can differentiate between pinprick and light touch bilaterally.   Cardiovascular: Normal rate, regular rhythm and intact distal pulses.   Pulmonary/Chest: Effort normal and breath sounds normal. No respiratory distress. She has no wheezes. She has no rales. She exhibits no tenderness.  Abdominal: Soft. There is no tenderness.  Musculoskeletal: Normal range of motion. She exhibits no edema or tenderness.  Left shoulder:  Shoulder with no deformity. FROM to shoulder and elbow. No TTP of rotator cuff musculature. Drop arm negative. Neurovascularly intact   Neurological: She is alert and oriented to person, place, and time.  Skin: She is not diaphoretic.  Psychiatric: She has a normal mood and affect.  Nursing note and vitals reviewed.   ED Course  Procedures (including critical care time) Labs Review Labs Reviewed - No data to display  Imaging Review No results found. I have personally reviewed and evaluated these images and lab results as part of my medical decision-making.   EKG Interpretation None      MDM   Final diagnoses:  Chronic pain    Filed Vitals:   04/02/16 0630 04/02/16 0700 04/02/16 0745 04/02/16 0830  BP: 132/89 122/96 135/90 118/90  Pulse: 74 79 75 75  Temp: 97.6 F (36.4 C)     TempSrc: Oral     Resp: 22   20  SpO2: 99% 99% 99% 100%    Medications  methocarbamol (ROBAXIN) tablet 500 mg (500 mg Oral Given 04/02/16 0729)    Sara Garrison is 40 y.o. female presenting with Exacerbation of chronic fibromyalgia pain. Not different from prior. Patient has significant allergies, will try Robaxin.  Patient reports improvement with Robaxin, patient evaluated one hour after administration, no hives, swelling, lung sounds clear to auscultation, no nausea or vomiting, no indication of allergic reaction. Patient will be written a prescription for Robaxin.  Evaluation does not show pathology that  would require ongoing emergent intervention or inpatient treatment. Pt is hemodynamically stable and mentating appropriately. Discussed findings and plan with patient/guardian, who agrees with care plan. All questions answered. Return precautions discussed and outpatient follow up given.   New Prescriptions   METHOCARBAMOL (ROBAXIN) 500 MG TABLET    Take 2 tablets (1,000 mg total) by mouth 4 (four) times daily as needed (Pain).         Wynetta Emeryicole Shonia Skilling, PA-C 04/02/16 [phone]  Tilden FossaElizabeth Rees, MD 04/03/16 [phone]

## 2016-04-06 ENCOUNTER — Encounter (HOSPITAL_COMMUNITY): Payer: Self-pay | Admitting: Emergency Medicine

## 2016-04-06 ENCOUNTER — Emergency Department (HOSPITAL_COMMUNITY)
Admission: EM | Admit: 2016-04-06 | Discharge: 2016-04-06 | Disposition: A | Discharge status: Home / Self Care | Payer: Medicaid Other | Attending: Emergency Medicine | Admitting: Emergency Medicine

## 2016-04-06 DIAGNOSIS — E039 Hypothyroidism, unspecified: Secondary | ICD-10-CM | POA: Insufficient documentation

## 2016-04-06 DIAGNOSIS — L918 Other hypertrophic disorders of the skin: Secondary | ICD-10-CM

## 2016-04-06 DIAGNOSIS — M436 Torticollis: Secondary | ICD-10-CM | POA: Diagnosis not present

## 2016-04-06 DIAGNOSIS — M542 Cervicalgia: Secondary | ICD-10-CM | POA: Diagnosis present

## 2016-04-06 MED ORDER — LIDOCAINE-EPINEPHRINE-TETRACAINE (LET) SOLUTION
3.0000 mL | Freq: Once | NASAL | Status: AC
  Administered 2016-04-06: 3 mL via TOPICAL
  Filled 2016-04-06: qty 3

## 2016-04-06 MED ORDER — ORPHENADRINE CITRATE ER 100 MG PO TB12: 100.0000 mg | ORAL_TABLET | Freq: Two times a day (BID) | ORAL | Status: AC

## 2016-04-06 MED ORDER — DEXAMETHASONE SODIUM PHOSPHATE 10 MG/ML IJ SOLN
10.0000 mg | Freq: Once | INTRAMUSCULAR | Status: AC
  Administered 2016-04-06: 10 mg via INTRAMUSCULAR
  Filled 2016-04-06: qty 1

## 2016-04-06 NOTE — ED Provider Notes (Signed)
CSN: [id number]     Arrival date & time 04/06/16  1813 History   First MD Initiated Contact with Patient 04/06/16 1838     No chief complaint on file.    (Consider location/radiation/quality/duration/timing/severity/associated sxs/prior Treatment) HPI Patient ports that she's been getting spasms in the muscles of her neck for a week now. She reports that they're very tight and burning on the sides of her neck and worse with twisting and moving. She did get seen in the emergency department and started on Robaxin. She reports it was initially helping somewhat but now it's more stiff again. No weakness numbness or tingling in her arms. No associated headache, fever or chills. No sore throat or difficulty swallowing. He denies any new activities or injury. She also reports that she has a little skin polyp on the right side of her neck as been burning and intensely sensitive. She reports that if anything touches that it really hurts. Past Medical History  Diagnosis Date  . HSV-1 (herpes simplex virus 1) infection   . Hypothyroid   . Fibromyalgia   . Migraines   . Nausea & vomiting   . Shingles    Past Surgical History  Procedure Laterality Date  . Abdominal hysterectomy  2002    RSO   . Tubal ligation    . Cholecystectomy     Family History  Problem Relation Age of Onset  . Hypertension Mother   . Diabetes Mother    Social History  Substance Use Topics  . Smoking status: Never Smoker   . Smokeless tobacco: Never Used  . Alcohol Use: 0.6 oz/week    1 Glasses of wine per week     Comment: occassional   OB History    Gravida Para Term Preterm AB TAB SAB Ectopic Multiple Living   4 2   2 2    2      Review of Systems 10 Systems reviewed and are negative f or acute change except as noted in the HPI.    Allergies  Aspirin; Codeine; Hydrocodone-acetaminophen; Other; Penicillins; Synthroid; and Tylenol  Home Medications   Prior to Admission medications   Medication Sig Start  Date End Date Taking? Authorizing Provider  albuterol (PROVENTIL HFA;VENTOLIN HFA) 108 (90 BASE) MCG/ACT inhaler Inhale 2 puffs into the lungs every 6 (six) hours as needed. For shortness of breath   Yes Historical Provider, MD  Budesonide (PULMICORT FLEXHALER IN) Inhale 2 puffs into the lungs daily as needed (shortness of breathe.). Shortness of breath and wheezing   Yes Historical Provider, MD  HYDROmorphone (DILAUDID) 4 MG tablet Take 1 tablet (4 mg total) by mouth every 6 (six) hours as needed for severe pain. Patient taking differently: Take 2-4 mg by mouth every 6 (six) hours as needed for severe pain.  01/15/15  Yes Vanetta MuldersScott Zackowski, MD  hydrOXYzine (ATARAX) 50 MG tablet Take 50-100 mg by mouth 2 (two) times daily. 50 mg in the morning and 100 mg at bedtime   Yes Historical Provider, MD  levothyroxine (SYNTHROID) 25 MCG tablet Take 25 mcg by mouth daily before breakfast.   Yes Historical Provider, MD  Liniments (SALONPAS) PADS Apply 1 each topically daily as needed (pain).   Yes Historical Provider, MD  methocarbamol (ROBAXIN) 500 MG tablet Take 2 tablets (1,000 mg total) by mouth 4 (four) times daily as needed (Pain). 04/02/16  Yes Nicole Pisciotta, PA-C  orphenadrine (NORFLEX) 100 MG tablet Take 1 tablet (100 mg total) by mouth 2 (two) times daily.  04/06/16   Arby Barrette, MD  traMADol (ULTRAM) 50 MG tablet Take 1 tablet (50 mg total) by mouth every 6 (six) hours as needed for pain. Patient not taking: Reported on 04/06/2016 08/23/13   Harrington Challenger, NP   BP 140/91 mmHg  Pulse 72  Temp(Src) 98.1 F (36.7 C) (Oral)  Resp 18  SpO2 100% Physical Exam  Constitutional: She is oriented to person, place, and time. She appears well-developed and well-nourished.  HENT:  Head: Normocephalic and atraumatic.  Right Ear: External ear normal.  Left Ear: External ear normal.  Nose: Nose normal.  Mouth/Throat: Oropharynx is clear and moist.  Bilateral TMs normal. No facial swelling, trismus or  rashes.  Eyes: EOM are normal. Pupils are equal, round, and reactive to light.  Neck: Neck supple.  Patient endorses tenderness to palpation of the paraspinous muscle bodies bilaterally in the cervical region. She also endorses tenderness over the trapezius. No meningismus. No soft tissue masses or fullness. She has a approximately 4 mm polyp on the right side of her neck. This is just slightly inflamed with no associated skin changes of cellulitis. There is also a very small noninflamed polyp on the left side of her neck.  Cardiovascular: Normal rate, regular rhythm, normal heart sounds and intact distal pulses.   Pulmonary/Chest: Effort normal and breath sounds normal.  Abdominal: Soft. Bowel sounds are normal. She exhibits no distension. There is no tenderness.  Musculoskeletal: Normal range of motion. She exhibits no edema or tenderness.  Neurological: She is alert and oriented to person, place, and time. She has normal strength. No cranial nerve deficit. She exhibits normal muscle tone. Coordination normal. GCS eye subscore is 4. GCS verbal subscore is 5. GCS motor subscore is 6.  Skin: Skin is warm, dry and intact.  Psychiatric: She has a normal mood and affect.     ED Course  Procedures (including critical care time)  Integument removal: Betadine prep. 2 small skin tags shaved off with scalpel. Scalpel 11 blade removal of small skin tag right and left  base of neck. Dressed with bandaide. Simple procedure.   Labs Review Labs Reviewed - No data to display  Imaging Review No results found. I have personally reviewed and evaluated these images and lab results as part of my medical decision-making.   EKG Interpretation None      MDM   Final diagnoses:  Inflamed skin tag  Torticollis, acute   Patient complains of ongoing neck stiffness is infectious symptoms. No fever no chills. No ENT symptoms. No neurologic complaints. Reproducible muscle body tenderness to palpation.  Reported a lot of burnig/stinging of small skin tag that contacts clothes (removed). At discharge, patient comfortable in appearance. Will try Norflex and heating pads for muscle spasm.    Arby Barrette, MD 04/10/16 [phone]

## 2016-04-06 NOTE — ED Notes (Signed)
Patient was alert, oriented and stable upon discharge. RN went over AVS and patient had no further questions.  

## 2016-04-06 NOTE — ED Notes (Signed)
Patient presents for bilateral neck pain x1 week. Described as stinging and burning. Denies fever, vomiting. Rates pain 8/10.

## 2016-04-06 NOTE — Discharge Instructions (Signed)
Excision of Skin Lesions, Care After Refer to this sheet in the next few weeks. These instructions provide you with information about caring for yourself after your procedure. Your health care provider may also give you more specific instructions. Your treatment has been planned according to current medical practices, but problems sometimes occur. Call your health care provider if you have any problems or questions after your procedure. WHAT TO EXPECT AFTER THE PROCEDURE After your procedure, it is common to have pain or discomfort at the excision site. HOME CARE INSTRUCTIONS  Take over-the-counter and prescription medicines only as told by your health care provider.  Follow instructions from your health care provider about:  How to take care of your excision site. You should keep the site clean, dry, and protected for at least 48 hours.  When and how you should change your bandage (dressing).  When you should remove your dressing.  Removing whatever was used to close your excision site.  Check the excision area every day for signs of infection. Watch for:  Redness, swelling, or pain.  Fluid, blood, or pus.  For bleeding, apply gentle but firm pressure to the area using a folded towel for 20 minutes.  Avoid high-impact exercise and activities until the stitches (sutures) are removed or the area heals.  Follow instructions from your health care provider about how to minimize scarring. Avoid sun exposure until the area has healed. Scarring should lessen over time.  Keep all follow-up visits as told by your health care provider. This is important. SEEK MEDICAL CARE IF:  You have a fever.  You have redness, swelling, or pain at the excision site.  You have fluid, blood, or pus coming from the excision site.  You have ongoing bleeding at the excision site.  You have pain that does not improve in 2-3 days after your procedure.  You notice skin irregularities or changes in  sensation.   This information is not intended to replace advice given to you by your health care provider. Make sure you discuss any questions you have with your health care provider.   Document Released: 03/10/2015 Document Reviewed: 03/10/2015 Elsevier Interactive Patient Education 2016 [employer].   Acute Torticollis Torticollis is a condition in which the muscles of the neck tighten (contract) abnormally, causing the neck to twist and the head to move into an unnatural position. Torticollis that develops suddenly is called acute torticollis. If torticollis becomes chronic and is left untreated, the face and neck can become deformed. CAUSES This condition may be caused by:  Sleeping in an awkward position (common).  Extending or twisting the neck muscles beyond their normal position.  Infection. In some cases, the cause may not be known. SYMPTOMS Symptoms of this condition include:  An unnatural position of the head.  Neck pain.  A limited ability to move the neck.  Twisting of the neck to one side. DIAGNOSIS This condition is diagnosed with a physical exam. You may also have imaging tests, such as an X-ray, CT scan, or MRI. TREATMENT Treatment for this condition involves trying to relax the neck muscles. It may include:  Medicines or shots.  Physical therapy.  Surgery. This may be done in severe cases. HOME CARE INSTRUCTIONS  Take medicines only as directed by your health care provider.  Do stretching exercises and massage your neck as directed by your health care provider.  Keep all follow-up visits as directed by your health care provider. This is important. SEEK MEDICAL CARE IF:  You develop a fever. SEEK IMMEDIATE MEDICAL CARE IF:  You develop difficulty breathing.  You develop noisy breathing (stridor).  You start drooling.  You have trouble swallowing or have pain with swallowing.  You develop numbness or weakness in your hands or feet.  You  have changes in your speech, understanding, or vision.  Your pain gets worse.   This information is not intended to replace advice given to you by your health care provider. Make sure you discuss any questions you have with your health care provider.   Document Released: 10/21/2000 Document Revised: 03/10/2015 Document Reviewed: 10/20/2014 Elsevier Interactive Patient Education [employer]

## 2016-04-15 ENCOUNTER — Encounter (HOSPITAL_COMMUNITY): Payer: Self-pay | Admitting: Oncology

## 2016-04-15 ENCOUNTER — Emergency Department (HOSPITAL_COMMUNITY)
Admission: EM | Admit: 2016-04-15 | Discharge: 2016-04-15 | Disposition: A | Discharge status: Home / Self Care | Payer: Medicaid Other | Attending: Emergency Medicine | Admitting: Emergency Medicine

## 2016-04-15 DIAGNOSIS — Y999 Unspecified external cause status: Secondary | ICD-10-CM | POA: Diagnosis not present

## 2016-04-15 DIAGNOSIS — Y9384 Activity, sleeping: Secondary | ICD-10-CM | POA: Insufficient documentation

## 2016-04-15 DIAGNOSIS — S30860A Insect bite (nonvenomous) of lower back and pelvis, initial encounter: Secondary | ICD-10-CM | POA: Diagnosis present

## 2016-04-15 DIAGNOSIS — E039 Hypothyroidism, unspecified: Secondary | ICD-10-CM | POA: Insufficient documentation

## 2016-04-15 DIAGNOSIS — L989 Disorder of the skin and subcutaneous tissue, unspecified: Secondary | ICD-10-CM

## 2016-04-15 DIAGNOSIS — Y9259 Other trade areas as the place of occurrence of the external cause: Secondary | ICD-10-CM | POA: Insufficient documentation

## 2016-04-15 DIAGNOSIS — W57XXXA Bitten or stung by nonvenomous insect and other nonvenomous arthropods, initial encounter: Secondary | ICD-10-CM | POA: Insufficient documentation

## 2016-04-15 MED ORDER — DIPHENHYDRAMINE HCL 25 MG PO TABS
25.0000 mg | ORAL_TABLET | Freq: Four times a day (QID) | ORAL | Status: AC
Start: 2016-04-15 — End: ?

## 2016-04-15 MED ORDER — VALACYCLOVIR HCL 1 G PO TABS: 500.0000 mg | ORAL_TABLET | Freq: Two times a day (BID) | ORAL | Status: AC

## 2016-04-15 MED ORDER — DIPHENHYDRAMINE HCL 25 MG PO CAPS
25.0000 mg | ORAL_CAPSULE | Freq: Once | ORAL | Status: AC
  Administered 2016-04-15: 25 mg via ORAL
  Filled 2016-04-15: qty 1

## 2016-04-15 NOTE — ED Notes (Signed)
Pt presents d/t back pain as well as insect bit to face right shoulder and right elbow.  Pt states she has been staying in a hotel recently.  Pt rates her back pain 8/10, aching in nature.  Sore to pt's mid lower back as well.  She has had it examined in the past w/ no clear answer as to what the problem is.

## 2016-04-15 NOTE — Discharge Instructions (Signed)
Follow up with your PCP. You may need to see a dermatologist.  Insect Bite Mosquitoes, flies, fleas, bedbugs, and many other insects can bite. Insect bites are different from insect stings. A sting is when poison (venom) is injected into the skin. Insect bites can cause pain or itching for a few days, but they are usually not serious. Some insects can spread diseases to people through a bite. SYMPTOMS  Symptoms of an insect bite include:  Itching or pain in the bite area.  Redness and swelling in the bite area.  An open wound (skin ulcer). In many cases, symptoms last for 2-4 days.  DIAGNOSIS  This condition is usually diagnosed based on symptoms and a physical exam. TREATMENT  Treatment is usually not needed for an insect bite. Symptoms often go away on their own. Your health care provider may recommend creams or lotions to help reduce itching. Antibiotic medicines may be prescribed if the bite becomes infected. A tetanus shot may be given in some cases. If you develop an allergic reaction to an insect bite, your health care provider will prescribe medicines to treat the reaction (antihistamines). This is rare. HOME CARE INSTRUCTIONS  Do not scratch the bite area.  Keep the bite area clean and dry. Wash the bite area daily with soap and water as told by your health care provider.  If directed, applyice to the bite area.  Put ice in a plastic bag.  Place a towel between your skin and the bag.  Leave the ice on for 20 minutes, 2-3 times per day.  To help reduce itching and swelling, try applying a baking soda paste, cortisone cream, or calamine lotion to the bite area as told by your health care provider.  Apply or take over-the-counter and prescription medicines only as told by your health care provider.  If you were prescribed an antibiotic medicine, use it as told by your health care provider. Do not stop using the antibiotic even if your condition improves.  Keep all  follow-up visits as told by your health care provider. This is important. PREVENTION   Use insect repellent. The best insect repellents contain:  DEET, picaridin, oil of lemon eucalyptus (OLE), or IR3535.  Higher amounts of an active ingredient.  When you are outdoors, wear clothing that covers your arms and legs.  Avoid opening windows that do not have window screens. SEEK MEDICAL CARE IF:  You have increased redness, swelling, or pain in the bite area.  You have a fever. SEEK IMMEDIATE MEDICAL CARE IF:   You have joint pain.   You have fluid, blood, or pus coming from the bite area.  You have a headache or neck pain.  You have unusual weakness.  You have a rash.  You have chest pain or shortness of breath.  You have abdominal pain, nausea, or vomiting.  You feel unusually tired or sleepy.   This information is not intended to replace advice given to you by your health care provider. Make sure you discuss any questions you have with your health care provider.   Document Released: 12/01/2004 Document Revised: 07/15/2015 Document Reviewed: 03/11/2015 Elsevier Interactive Patient Education [employer]2016 [employer] Inc[employer]

## 2016-04-15 NOTE — ED Provider Notes (Signed)
CSN: [id number]     Arrival date & time 04/15/16  0302 History   First MD Initiated Contact with Patient 04/15/16 [phone]     Chief Complaint  Patient presents with  . Back Pain  . Insect Bite   HPI  [name] is a 40 year old female with PMHx of HSV, hypothyroidism, fibromyalgia presenting with back pain and insect bites. Patient reports she has been sleeping in a hotel for the past 2 evenings. Each morning she has woken up with pruritic bug bites to her upper extremities and face. She states her son is staying in a hotel with her but does not have similar bug bites. They are not sharing a bed. Denies seeing bugs on her body or in the bed. She is also complaining of a rash to her lumbar back. The rash has been present past 5 days. It is located over her lumbar region and causing aching pain. She states that the pain radiates into her bilateral lower extremities. She believes the rash is related to aspartame but denies recent intake. She has been evaluated by her PCP and neurology for this rash. She was told to take Valtrex whenever the rash appeared and that it was related to shingles. The rash has been intermittent over the past few years. She has never seen a dermatologist.   Past Medical History  Diagnosis Date  . HSV-1 (herpes simplex virus 1) infection   . Hypothyroid   . Fibromyalgia   . Migraines   . Nausea & vomiting   . Shingles    Past Surgical History  Procedure Laterality Date  . Abdominal hysterectomy  2002    RSO   . Tubal ligation    . Cholecystectomy     Family History  Problem Relation Age of Onset  . Hypertension Mother   . Diabetes Mother    Social History  Substance Use Topics  . Smoking status: Never Smoker   . Smokeless tobacco: Never Used  . Alcohol Use: 0.6 oz/week    1 Glasses of wine per week     Comment: occassional   OB History    Gravida Para Term Preterm AB TAB SAB Ectopic Multiple Living   4 2   2 2    2      Review of Systems  All other  systems reviewed and are negative.     Allergies  Aspirin; Codeine; Hydrocodone-acetaminophen; Other; Penicillins; Synthroid; and Tylenol  Home Medications   Prior to Admission medications   Medication Sig Start Date End Date Taking? Authorizing Provider  albuterol (PROVENTIL HFA;VENTOLIN HFA) 108 (90 BASE) MCG/ACT inhaler Inhale 2 puffs into the lungs every 6 (six) hours as needed. For shortness of breath   Yes Historical Provider, MD  Budesonide (PULMICORT FLEXHALER IN) Inhale 2 puffs into the lungs daily as needed (shortness of breathe.). Shortness of breath and wheezing   Yes Historical Provider, MD  HYDROmorphone (DILAUDID) 4 MG tablet Take 1 tablet (4 mg total) by mouth every 6 (six) hours as needed for severe pain. 01/15/15  Yes Vanetta MuldersScott Zackowski, MD  hydrOXYzine (ATARAX) 50 MG tablet Take 50-100 mg by mouth 2 (two) times daily. 50 mg in the morning and 100 mg at bedtime   Yes Historical Provider, MD  levothyroxine (SYNTHROID) 25 MCG tablet Take 25 mcg by mouth daily before breakfast.   Yes Historical Provider, MD  Liniments (SALONPAS) PADS Apply 1 each topically daily as needed (pain).   Yes Historical Provider, MD  methocarbamol (  ROBAXIN) 500 MG tablet Take 2 tablets (1,000 mg total) by mouth 4 (four) times daily as needed (Pain). 04/02/16  Yes Nicole Pisciotta, PA-C  orphenadrine (NORFLEX) 100 MG tablet Take 1 tablet (100 mg total) by mouth 2 (two) times daily. 04/06/16  Yes Arby Barrette, MD  diphenhydrAMINE (BENADRYL) 25 MG tablet Take 1 tablet (25 mg total) by mouth every 6 (six) hours. 04/15/16   Antanisha Mohs, PA-C  valACYclovir (VALTREX) 1000 MG tablet Take 0.5 tablets (500 mg total) by mouth 2 (two) times daily. 04/15/16 04/29/16  Rowdy Guerrini, PA-C   BP 146/86 mmHg  Pulse 73  Temp(Src) 97.5 F (36.4 C) (Oral)  Resp 16  SpO2 93% Physical Exam  Constitutional: She appears well-developed and well-nourished. No distress.  HENT:  Head: Normocephalic and atraumatic.  Eyes:  Conjunctivae are normal. Right eye exhibits no discharge. Left eye exhibits no discharge. No scleral icterus.  Neck: Normal range of motion.  Cardiovascular: Normal rate, regular rhythm and intact distal pulses.   Pulmonary/Chest: Effort normal. No respiratory distress.  Musculoskeletal: Normal range of motion.  Generalized tenderness to palpation of the lumbar region. Full range of motion of the spine intact. Negative straight leg raise. Full range of motion of the bilateral lower extremities.  Neurological: She is alert. Coordination normal.  5/5 strength of the bilateral lower extremities. Sensation to light touch intact throughout  Skin: Skin is warm and dry.  Few flesh-colored papules noted to right upper extremity and right cheek. Overlying excoriations without skin breakdown from patient scratching. No visible bugs.  2 cm vesicular lesion with faint surrounding erythema noted to midline lumbar back. No purulence or induration. No warmth. No crusting. The lesion is solitary. No clustering.  Psychiatric: She has a normal mood and affect. Her behavior is normal.  Nursing note and vitals reviewed.   ED Course  Procedures (including critical care time) Labs Review Labs Reviewed - No data to display  Imaging Review No results found. I have personally reviewed and evaluated these images and lab results as part of my medical decision-making.   EKG Interpretation None      MDM   Final diagnoses:  Bug bites  Skin lesion   40 year old female presenting with bug bites and rash over her lumbar region. Recent stay in a hotel before onset of bug bites. Rash has been intermittent over the past few years and has been evaluated by neurology and PCP. Patient supposed to take Valtrex when the rash appears but she has not done so. Afebrile and nontoxic appearing. Multiple flesh-colored papules noted over the right upper extremity and face. Patient is actively scratching at these there are old  excoriations present. Possible bedbugs from hotel stay. Encouraged patient to switch hotels which she states she has already organized. She does have one vesicular lesion in her lumbar region. No signs of abscess or superficial skin infection. Given rashes intermittent presentation and length of time it has been there, I do not believe this is an acute medical emergency. Will discharge with valtrex as instructed by her PCP. Patient has not seen a dermatologist; discussed with patient that she may need to follow-up with her PCP in order to get referred to one. Return precautions given in discharge paperwork and discussed with pt at bedside. Pt stable for discharge     Alveta Heimlich, PA-C 04/15/16 1610  Geoffery Lyons, MD 04/15/16 [phone]

## 2016-12-25 IMAGING — CT CT ABD-PELV W/ CM
2 of 5 series · 15 of 46 positions shown, 17 images · IV contrast (APPLIED)
Comparison: CT scan 07/11/2013

CLINICAL DATA: Lower abdominal pain, nausea, status post
hysterectomy, cholecystectomy, bilateral tubal ligation

EXAM:
CT ABDOMEN AND PELVIS WITH CONTRAST
TECHNIQUE: Multidetector CT imaging of the abdomen and pelvis was performed
using the standard protocol following bolus administration of
intravenous contrast.
CONTRAST:  25mL OMNIPAQUE IOHEXOL 300 MG/ML SOLN, 100mL OMNIPAQUE
IOHEXOL 300 MG/ML SOLN

[Series 4: abd/pelvis 5.0 b31f · axial · 0.67mm/px · z∈[-653,-228]mm · 12 of 95 slices shown, 14 images]
[im 5/95  soft-tissue]
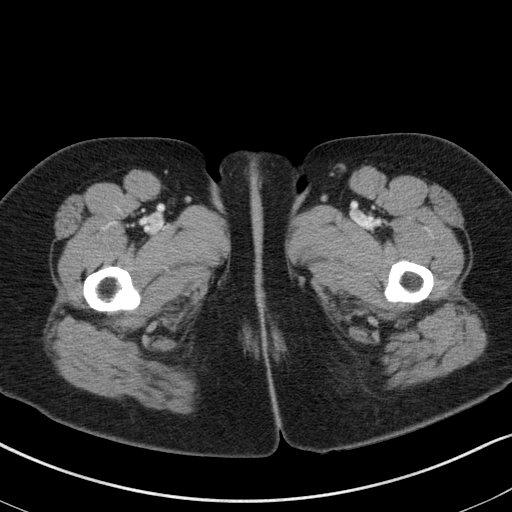
[im 5/95  bone]
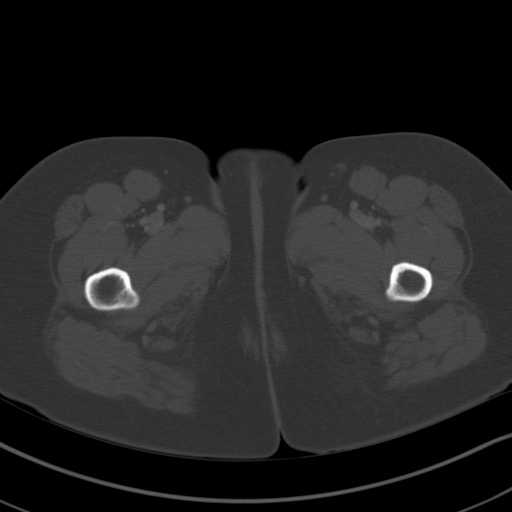
[im 15/95  soft-tissue]
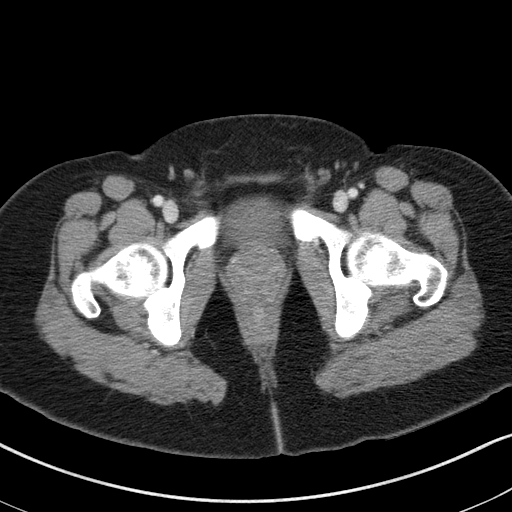
[im 19/95  soft-tissue]
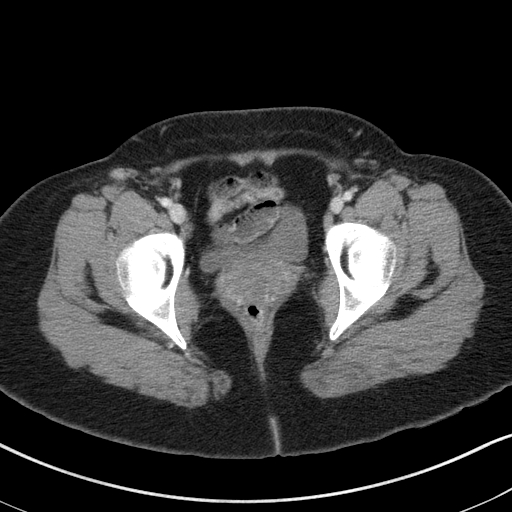
[im 29/95  soft-tissue]
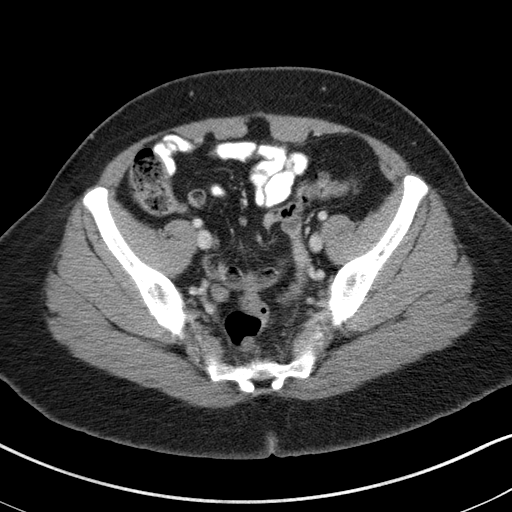
[im 38/95  soft-tissue]
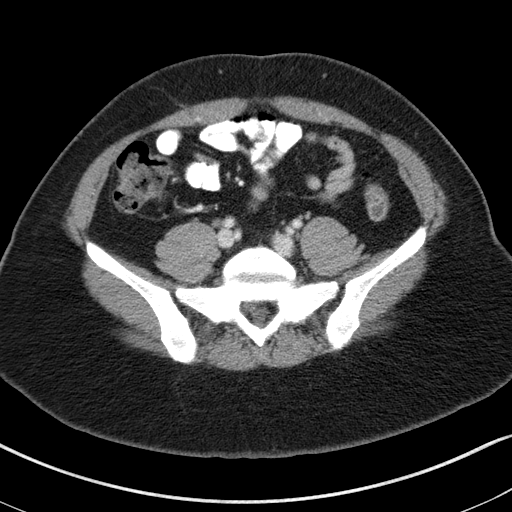
[im 43/95  soft-tissue]
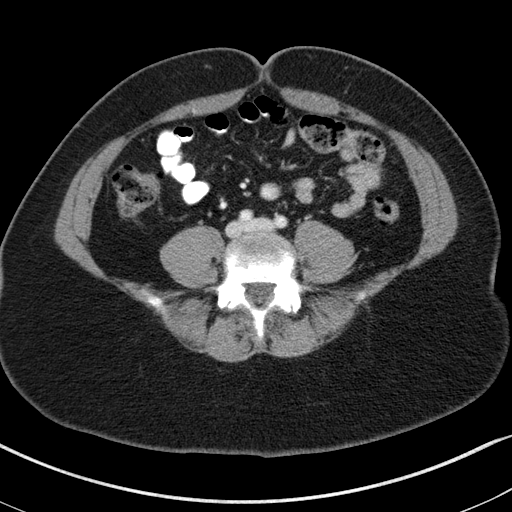
[im 52/95  soft-tissue]
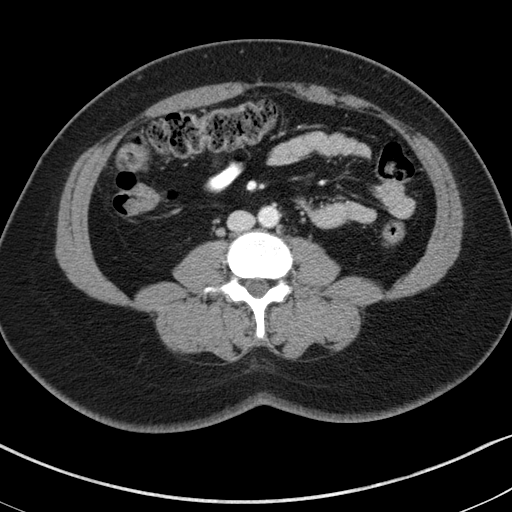
[im 57/95  soft-tissue]
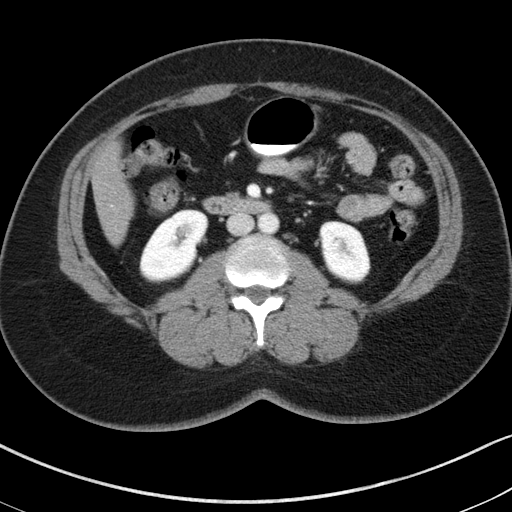
[im 66/95  soft-tissue]
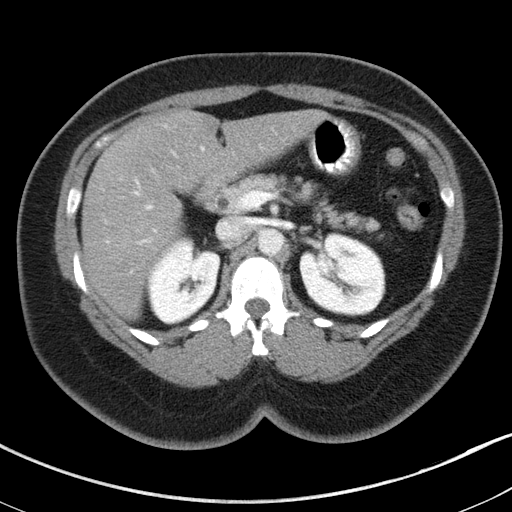
[im 66/95  bone]
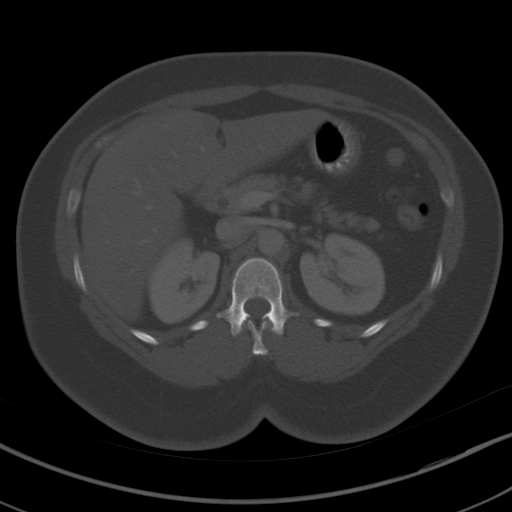
[im 76/95  soft-tissue]
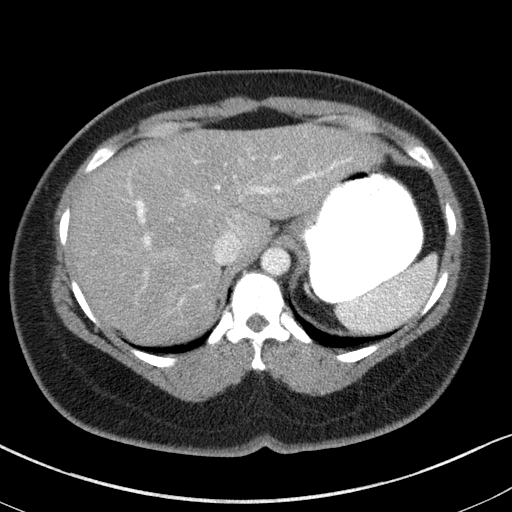
[im 80/95  soft-tissue]
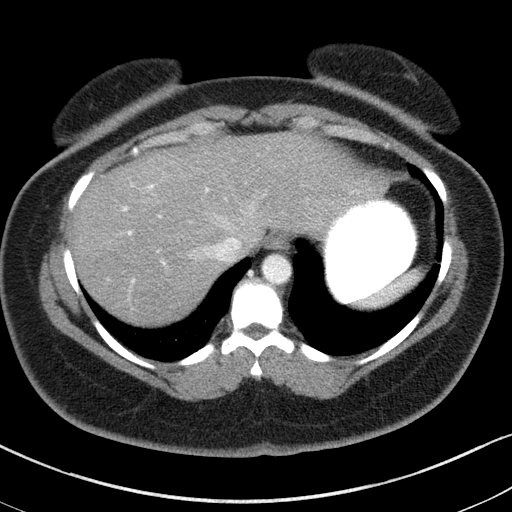
[im 90/95  soft-tissue]
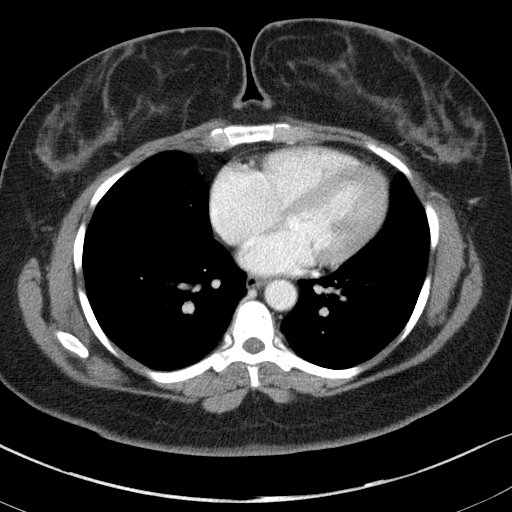

[Series 5: abd/pelvis 3.0 coronal · coronal · 0.67mm/px · 3 of 91 slices shown]
[im 31/91  soft-tissue]
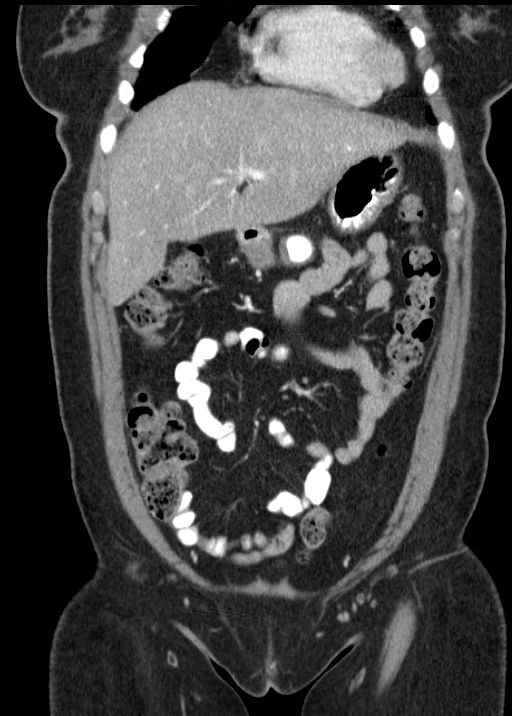
[im 41/91  soft-tissue]
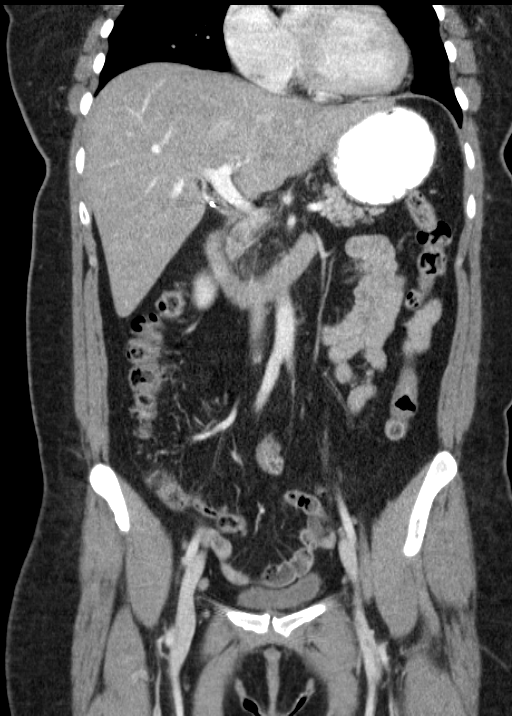
[im 51/91  soft-tissue]
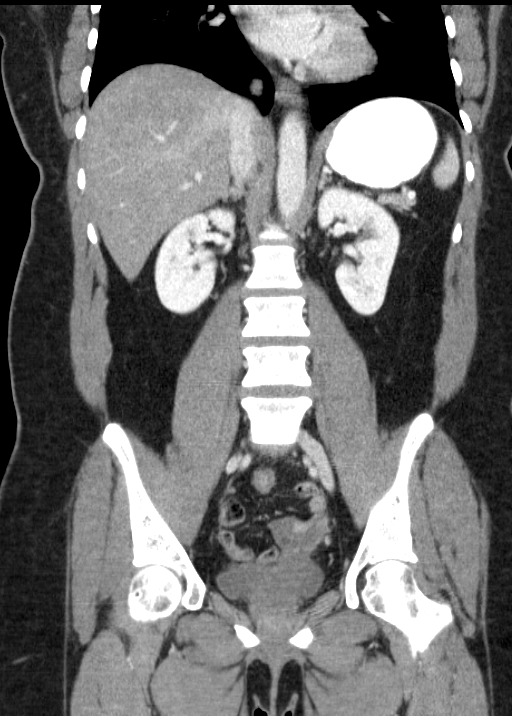

[15 of 46 positions shown; findings below may reference images not displayed]

FINDINGS: Sagittal images of the spine are unremarkable.

Significant fatty infiltration of the liver again noted. Lung bases
are unremarkable. There is mild thickening of pyloric wall. Clinical
correlation is necessary to exclude mild inflammatory changes.

CBD measures 6.8 mm in diameter.

The pancreas, spleen and adrenal glands are unremarkable. Abdominal
aorta is unremarkable.

Kidneys are symmetrical in size and enhancement. No hydronephrosis
or hydroureter.

Moderate stool noted in right colon and proximal transverse colon.
No pericecal inflammation. Normal retrocecal appendix noted in axial
image 68. The terminal ileum is unremarkable.

The patient is status postcholecystectomy.

No small bowel obstruction. No ascites or free air. No adenopathy.
Delayed renal images shows bilateral renal symmetrical excretion.

Few diverticula are noted distal sigmoid colon. No evidence of acute
diverticulitis. The uterus is surgically absent. There is a cyst/
follicle within left ovary measures 1.9 cm. A second cyst/follicle
within left ovary measures 1.6 cm. No distal colonic obstruction.
Urinary bladder is unremarkable.
IMPRESSION: 1. Again noted significant fatty infiltration of the liver.
2. Mild thickening of pyloric wall. Mild inflammatory changes at
this level cannot be excluded. Clinical correlation is necessary.
3. Status postcholecystectomy.
4. No hydronephrosis or hydroureter.
5. Normal appendix.  No pericecal inflammation.
6. Surgically absent uterus. There is a cyst/follicle within left
ovary measures 1.9 cm. Second cyst/follicle within left ovary
measures 1.6 cm. Unremarkable urinary bladder.

## 2017-08-21 NOTE — Telephone Encounter (Signed)
Close Encounter 

## 2019-05-10 ENCOUNTER — Encounter (HOSPITAL_BASED_OUTPATIENT_CLINIC_OR_DEPARTMENT_OTHER): Payer: Self-pay

## 2019-05-10 ENCOUNTER — Other Ambulatory Visit: Payer: Self-pay

## 2019-05-10 ENCOUNTER — Emergency Department (HOSPITAL_BASED_OUTPATIENT_CLINIC_OR_DEPARTMENT_OTHER)
Admission: EM | Admit: 2019-05-10 | Discharge: 2019-05-11 | Disposition: A | Discharge status: Home / Self Care | Payer: BLUE CROSS/BLUE SHIELD | Attending: Emergency Medicine | Admitting: Emergency Medicine

## 2019-05-10 DIAGNOSIS — Z79899 Other long term (current) drug therapy: Secondary | ICD-10-CM | POA: Insufficient documentation

## 2019-05-10 DIAGNOSIS — R51 Headache: Secondary | ICD-10-CM | POA: Diagnosis present

## 2019-05-10 DIAGNOSIS — G43009 Migraine without aura, not intractable, without status migrainosus: Secondary | ICD-10-CM | POA: Diagnosis not present

## 2019-05-10 DIAGNOSIS — E039 Hypothyroidism, unspecified: Secondary | ICD-10-CM | POA: Insufficient documentation

## 2019-05-10 LAB — PREGNANCY, URINE: Preg Test, Ur: NEGATIVE

## 2019-05-10 MED ORDER — MAGNESIUM SULFATE 2 GM/50ML IV SOLN
2.0000 g | Freq: Once | INTRAVENOUS | Status: AC
  Administered 2019-05-11: 2 g via INTRAVENOUS
  Filled 2019-05-10: qty 50

## 2019-05-10 MED ORDER — METOCLOPRAMIDE HCL 5 MG/ML IJ SOLN
10.0000 mg | Freq: Once | INTRAMUSCULAR | Status: AC
  Administered 2019-05-11: 10 mg via INTRAVENOUS
  Filled 2019-05-10: qty 2

## 2019-05-10 MED ORDER — SODIUM CHLORIDE 0.9 % IV BOLUS
500.0000 mL | Freq: Once | INTRAVENOUS | Status: AC
  Administered 2019-05-11: 500 mL via INTRAVENOUS

## 2019-05-10 MED ORDER — DIPHENHYDRAMINE HCL 50 MG/ML IJ SOLN
12.5000 mg | Freq: Once | INTRAMUSCULAR | Status: AC
  Administered 2019-05-11: 12.5 mg via INTRAVENOUS
  Filled 2019-05-10: qty 1

## 2019-05-10 NOTE — ED Triage Notes (Signed)
C/o HA x 2 weeks-was seen by PCP today and was told BP elevated and to keep record x 1 week and f/u-was given rx tramadol but has not filled-NAD-steady gait

## 2019-05-11 ENCOUNTER — Encounter (HOSPITAL_BASED_OUTPATIENT_CLINIC_OR_DEPARTMENT_OTHER): Payer: Self-pay | Admitting: Emergency Medicine

## 2019-05-11 LAB — CBG MONITORING, ED: Glucose-Capillary: 103 mg/dL — ABNORMAL HIGH (ref 70–99)

## 2019-05-11 MED ORDER — ACETAMINOPHEN 500 MG PO TABS
ORAL_TABLET | ORAL | Status: AC
  Filled 2019-05-11: qty 2

## 2019-05-11 MED ORDER — AMOXICILLIN-POT CLAVULANATE 875-125 MG PO TABS
ORAL_TABLET | ORAL | Status: AC
  Filled 2019-05-11: qty 1

## 2019-05-11 MED ORDER — METOCLOPRAMIDE HCL 10 MG PO TABS: 10.0000 mg | ORAL_TABLET | Freq: Three times a day (TID) | ORAL | 0 refills | Status: AC | PRN

## 2019-05-11 NOTE — ED Notes (Signed)
Pt understood dc material. NAD noted. Scripts sent electronically. All questions answered to satisfaction. Pt escorted to check out counter. 

## 2019-05-11 NOTE — ED Notes (Signed)
Medications were pulled under wrong profile. RN returned medication and pulled on approp profile.

## 2019-05-11 NOTE — ED Notes (Signed)
Pt ambulated to restroom without assistance. NAD noted 

## 2019-05-11 NOTE — ED Provider Notes (Signed)
[facility] EMERGENCY DEPARTMENT Provider Note   CSN: [id number] Arrival date & time: 05/10/19  2245     History   Chief Complaint Chief Complaint  Patient presents with  . Headache    HPI Sara Garrison is a 43 y.o. female.     The history is provided by the patient.  Migraine This is a recurrent problem. The current episode started more than 1 week ago (2 weeks). The problem occurs constantly. The problem has not changed since onset.Associated symptoms include headaches. Pertinent negatives include no chest pain and no abdominal pain. Nothing aggravates the symptoms. Nothing relieves the symptoms. Treatments tried: tramadol. The treatment provided no relief.  Patient with migraines and fibromylagia presents with right sided HA for 2 weeks seen by PMD today and symptoms have not resolved. Not sudden onset.  Not the worst HA.  No changes in vision or speech or cognition.  No weakness nor numbness.    Past Medical History:  Diagnosis Date  . Fibromyalgia   . HSV-1 (herpes simplex virus 1) infection   . Hypothyroid   . Migraines   . Nausea & vomiting   . Shingles     Patient Active Problem List   Diagnosis Date Noted  . Low back pain 08/27/2013  . Dyspareunia 07/18/2013  . Unspecified hypothyroidism 07/15/2013  . Fibromyalgia syndrome 07/15/2013  . Nausea & vomiting 02/16/2012  . Postoperative nausea and vomiting 05/24/2011    Past Surgical History:  Procedure Laterality Date  . ABDOMINAL HYSTERECTOMY  2002   RSO   . CHOLECYSTECTOMY    . TUBAL LIGATION       OB History    Gravida  4   Para  2   Term      Preterm      AB  2   Living  2     SAB      TAB  2   Ectopic      Multiple      Live Births               Home Medications    Prior to Admission medications   Medication Sig Start Date End Date Taking? Authorizing Provider  albuterol (PROVENTIL HFA;VENTOLIN HFA) 108 (90 BASE) MCG/ACT inhaler Inhale 2 puffs into the  lungs every 6 (six) hours as needed. For shortness of breath    [provider]  Budesonide (PULMICORT FLEXHALER IN) Inhale 2 puffs into the lungs daily as needed (shortness of breathe.). Shortness of breath and wheezing    [provider]  diphenhydrAMINE (BENADRYL) 25 MG tablet Take 1 tablet (25 mg total) by mouth every 6 (six) hours. 04/15/16   Barrett, Lahoma Crocker, PA-C  HYDROmorphone (DILAUDID) 4 MG tablet Take 1 tablet (4 mg total) by mouth every 6 (six) hours as needed for severe pain. 01/15/15   Fredia Sorrow, MD  hydrOXYzine (ATARAX) 50 MG tablet Take 50-100 mg by mouth 2 (two) times daily. 50 mg in the morning and 100 mg at bedtime    [provider]  levothyroxine (SYNTHROID) 25 MCG tablet Take 25 mcg by mouth daily before breakfast.    [provider]  Liniments (SALONPAS) PADS Apply 1 each topically daily as needed (pain).    [provider]  methocarbamol (ROBAXIN) 500 MG tablet Take 2 tablets (1,000 mg total) by mouth 4 (four) times daily as needed (Pain). 04/02/16   Pisciotta, Elmyra Ricks, PA-C  orphenadrine (NORFLEX) 100 MG tablet Take 1 tablet (100  mg total) by mouth 2 (two) times daily. 04/06/16   Arby BarrettePfeiffer, Marcy, MD    Family History Family History  Problem Relation Age of Onset  . Hypertension Mother   . Diabetes Mother     Social History Social History   Tobacco Use  . Smoking status: Never Smoker  . Smokeless tobacco: Never Used  Substance Use Topics  . Alcohol use: Yes    Comment: occassional  . Drug use: No     Allergies   Aspirin, Codeine, Hydrocodone-acetaminophen, Other, Penicillins, Synthroid [levothyroxine sodium], and Tylenol [acetaminophen]   Review of Systems Review of Systems  Constitutional: Negative for fever.  Eyes: Negative for photophobia, pain, redness and visual disturbance.  Cardiovascular: Negative for chest pain.  Gastrointestinal: Negative for abdominal pain and vomiting.  Musculoskeletal: Negative  for neck pain and neck stiffness.  Skin: Negative for rash.  Neurological: Positive for headaches. Negative for dizziness, seizures, facial asymmetry, speech difficulty and numbness.  Psychiatric/Behavioral: Negative for confusion.  All other systems reviewed and are negative.    Physical Exam Updated Vital Signs BP (!) 171/96 (BP Location: Left Arm)   Pulse 75   Temp 98.5 F (36.9 C) (Oral)   Resp 20   Ht 5' (1.524 m)   Wt 78.9 kg   SpO2 100%   BMI 33.98 kg/m   Physical Exam Vitals signs and nursing note reviewed.  Constitutional:      General: She is not in acute distress.    Appearance: She is normal weight.  HENT:     Head: Normocephalic and atraumatic.     Nose: Nose normal.     Mouth/Throat:     Mouth: Mucous membranes are moist.     Pharynx: Oropharynx is clear.  Eyes:     Extraocular Movements: Extraocular movements intact.     Conjunctiva/sclera: Conjunctivae normal.     Pupils: Pupils are equal, round, and reactive to light.     Comments: No proptosis intact cognition  Neck:     Musculoskeletal: Normal range of motion and neck supple. No neck rigidity.  Cardiovascular:     Rate and Rhythm: Normal rate and regular rhythm.     Pulses: Normal pulses.     Heart sounds: Normal heart sounds.  Pulmonary:     Effort: Pulmonary effort is normal.     Breath sounds: Normal breath sounds.  Abdominal:     General: Abdomen is flat. Bowel sounds are normal.     Tenderness: There is no abdominal tenderness. There is no guarding or rebound.  Musculoskeletal: Normal range of motion.  Lymphadenopathy:     Cervical: No cervical adenopathy.  Skin:    General: Skin is warm and dry.     Capillary Refill: Capillary refill takes less than 2 seconds.  Neurological:     General: No focal deficit present.     Mental Status: She is alert and oriented to person, place, and time.     Cranial Nerves: No cranial nerve deficit.     Coordination: Coordination normal.     Deep  Tendon Reflexes: Reflexes normal.  Psychiatric:        Mood and Affect: Mood normal.        Behavior: Behavior normal.      ED Treatments / Results  Labs (all labs ordered are listed, but only abnormal results are displayed) Labs Reviewed  CBG MONITORING, ED - Abnormal; Notable for the following components:      Result Value   Glucose-Capillary 103 (*)  All other components within normal limits  PREGNANCY, URINE    EKG None  Radiology No results found.  Procedures Procedures (including critical care time)  Medications Ordered in ED Medications  magnesium sulfate IVPB 2 g 50 mL (2 g Intravenous New Bag/Given 05/11/19 0011)  metoCLOPramide (REGLAN) injection 10 mg (10 mg Intravenous Given 05/11/19 0013)  diphenhydrAMINE (BENADRYL) injection 12.5 mg (12.5 mg Intravenous Given 05/11/19 0013)  sodium chloride 0.9 % bolus 500 mL (500 mLs Intravenous New Bag/Given 05/11/19 0009)      Well appearing no signs of ICH, infection or cavernous sinus thrombosis.  Follow up with your PMD for recheck.   Final Clinical Impressions(s) / ED Diagnoses       Return for intractable cough, coughing up blood,fevers >100.4 unrelieved by medication, shortness of breath, intractable vomiting, chest pain, shortness of breath, weakness,numbness, changes in speech, facial asymmetry,abdominal pain, passing out,Inability to tolerate liquids or food, cough, altered mental status or any concerns. No signs of systemic illness or infection. The patient is nontoxic-appearing on exam and vital signs are within normal limits.   I have reviewed the triage vital signs and the nursing notes. Pertinent labs &imaging results that were available during my care of the patient were reviewed by me and considered in my medical decision making (see chart for details).  After history, exam, and medical workup I feel the patient has been appropriately medically screened and is safe for discharge home. Pertinent  diagnoses were discussed with the patient. Patient was given return precautions       Brinda Focht, MD 05/11/19 [id number]
